# Patient Record
Sex: Male | Born: 1955 | Race: White | Hispanic: No | Marital: Married | Smoking: Never smoker
Health system: Southern US, Community
[De-identification: ages and names within clinical notes are randomized; demographics above are authoritative.]

## PROBLEM LIST (undated history)

## (undated) DIAGNOSIS — C44310 Basal cell carcinoma of skin of unspecified parts of face: Secondary | ICD-10-CM

## (undated) DIAGNOSIS — K439 Ventral hernia without obstruction or gangrene: Secondary | ICD-10-CM

## (undated) DIAGNOSIS — L659 Nonscarring hair loss, unspecified: Secondary | ICD-10-CM

## (undated) DIAGNOSIS — E785 Hyperlipidemia, unspecified: Secondary | ICD-10-CM

## (undated) HISTORY — PX: WISDOM TOOTH EXTRACTION: SHX21

## (undated) HISTORY — DX: Nonscarring hair loss, unspecified: L65.9

## (undated) HISTORY — DX: Ventral hernia without obstruction or gangrene: K43.9

## (undated) HISTORY — PX: OTHER SURGICAL HISTORY: SHX169

## (undated) HISTORY — DX: Hyperlipidemia, unspecified: E78.5

## (undated) HISTORY — PX: TONSILLECTOMY: SUR1361

## (undated) HISTORY — PX: APPENDECTOMY: SHX54

---

## 2005-08-17 ENCOUNTER — Encounter: Admission: RE | Admit: 2005-08-17 | Discharge: 2005-08-17 | Payer: Self-pay | Admitting: Internal Medicine

## 2005-10-12 ENCOUNTER — Ambulatory Visit: Payer: Self-pay | Admitting: Internal Medicine

## 2005-10-21 ENCOUNTER — Ambulatory Visit: Payer: Self-pay

## 2005-11-09 ENCOUNTER — Ambulatory Visit: Payer: Self-pay | Admitting: Internal Medicine

## 2006-07-18 ENCOUNTER — Ambulatory Visit: Payer: Self-pay | Admitting: Internal Medicine

## 2006-07-19 ENCOUNTER — Encounter (INDEPENDENT_AMBULATORY_CARE_PROVIDER_SITE_OTHER): Payer: Self-pay | Admitting: Specialist

## 2006-07-19 ENCOUNTER — Ambulatory Visit: Payer: Self-pay | Admitting: Internal Medicine

## 2006-09-29 ENCOUNTER — Ambulatory Visit: Payer: Self-pay | Admitting: Internal Medicine

## 2006-10-03 ENCOUNTER — Ambulatory Visit: Payer: Self-pay | Admitting: Cardiology

## 2006-10-07 ENCOUNTER — Inpatient Hospital Stay (HOSPITAL_COMMUNITY): Admission: AD | Admit: 2006-10-07 | Discharge: 2006-10-12 | Payer: Self-pay | Admitting: Cardiology

## 2006-10-07 ENCOUNTER — Ambulatory Visit: Payer: Self-pay | Admitting: Internal Medicine

## 2006-10-12 ENCOUNTER — Ambulatory Visit: Payer: Self-pay | Admitting: Internal Medicine

## 2006-11-09 ENCOUNTER — Ambulatory Visit: Payer: Self-pay | Admitting: Internal Medicine

## 2007-01-02 ENCOUNTER — Ambulatory Visit: Payer: Self-pay | Admitting: Internal Medicine

## 2007-08-08 ENCOUNTER — Ambulatory Visit: Payer: Self-pay | Admitting: Internal Medicine

## 2007-09-20 ENCOUNTER — Ambulatory Visit: Payer: Self-pay | Admitting: Internal Medicine

## 2007-11-27 DIAGNOSIS — K5190 Ulcerative colitis, unspecified, without complications: Secondary | ICD-10-CM | POA: Insufficient documentation

## 2007-11-27 DIAGNOSIS — E785 Hyperlipidemia, unspecified: Secondary | ICD-10-CM

## 2007-11-27 DIAGNOSIS — K649 Unspecified hemorrhoids: Secondary | ICD-10-CM | POA: Insufficient documentation

## 2008-03-18 ENCOUNTER — Ambulatory Visit: Payer: Self-pay | Admitting: Internal Medicine

## 2008-06-26 ENCOUNTER — Telehealth: Payer: Self-pay | Admitting: Internal Medicine

## 2008-07-02 ENCOUNTER — Ambulatory Visit: Payer: Self-pay | Admitting: Internal Medicine

## 2008-07-12 ENCOUNTER — Telehealth: Payer: Self-pay | Admitting: Internal Medicine

## 2008-11-29 ENCOUNTER — Ambulatory Visit: Payer: Self-pay | Admitting: Internal Medicine

## 2009-01-01 ENCOUNTER — Telehealth: Payer: Self-pay | Admitting: Internal Medicine

## 2009-02-03 ENCOUNTER — Ambulatory Visit: Payer: Self-pay | Admitting: Internal Medicine

## 2009-02-04 LAB — CONVERTED CEMR LAB: Glucose, Bld: 99 mg/dL (ref 70–99)

## 2009-03-04 ENCOUNTER — Telehealth: Payer: Self-pay | Admitting: Internal Medicine

## 2009-03-04 ENCOUNTER — Ambulatory Visit: Payer: Self-pay | Admitting: Internal Medicine

## 2009-03-05 LAB — CONVERTED CEMR LAB
Basophils Absolute: 0 10*3/uL (ref 0.0–0.1)
Basophils Relative: 0.6 % (ref 0.0–3.0)
Eosinophils Absolute: 0.1 10*3/uL (ref 0.0–0.7)
Eosinophils Relative: 2.1 % (ref 0.0–5.0)
HCT: 39.4 % (ref 39.0–52.0)
Hemoglobin: 14 g/dL (ref 13.0–17.0)
Lymphocytes Relative: 28.7 % (ref 12.0–46.0)
Lymphs Abs: 1.2 10*3/uL (ref 0.7–4.0)
MCHC: 35.6 g/dL (ref 30.0–36.0)
MCV: 91.3 fL (ref 78.0–100.0)
Monocytes Absolute: 0.4 10*3/uL (ref 0.1–1.0)
Monocytes Relative: 9.4 % (ref 3.0–12.0)
Neutro Abs: 2.5 10*3/uL (ref 1.4–7.7)
Neutrophils Relative %: 59.2 % (ref 43.0–77.0)
Platelets: 211 10*3/uL (ref 150.0–400.0)
RBC: 4.31 M/uL (ref 4.22–5.81)
RDW: 12.7 % (ref 11.5–14.6)
WBC: 4.2 10*3/uL — ABNORMAL LOW (ref 4.5–10.5)

## 2009-04-10 ENCOUNTER — Ambulatory Visit: Payer: Self-pay | Admitting: Internal Medicine

## 2009-04-10 LAB — CONVERTED CEMR LAB
Basophils Absolute: 0 10*3/uL (ref 0.0–0.1)
Basophils Relative: 0.3 % (ref 0.0–3.0)
Eosinophils Absolute: 0.2 10*3/uL (ref 0.0–0.7)
Eosinophils Relative: 3.3 % (ref 0.0–5.0)
HCT: 43.4 % (ref 39.0–52.0)
Hemoglobin: 15.1 g/dL (ref 13.0–17.0)
Lymphocytes Relative: 35 % (ref 12.0–46.0)
Lymphs Abs: 1.8 10*3/uL (ref 0.7–4.0)
MCHC: 34.7 g/dL (ref 30.0–36.0)
MCV: 91.4 fL (ref 78.0–100.0)
Monocytes Absolute: 0.5 10*3/uL (ref 0.1–1.0)
Monocytes Relative: 9.4 % (ref 3.0–12.0)
Neutro Abs: 2.5 10*3/uL (ref 1.4–7.7)
Neutrophils Relative %: 52 % (ref 43.0–77.0)
Platelets: 255 10*3/uL (ref 150.0–400.0)
RBC: 4.74 M/uL (ref 4.22–5.81)
RDW: 12.8 % (ref 11.5–14.6)
WBC: 5 10*3/uL (ref 4.5–10.5)

## 2009-05-27 ENCOUNTER — Ambulatory Visit: Payer: Self-pay | Admitting: Internal Medicine

## 2009-06-23 ENCOUNTER — Ambulatory Visit: Payer: Self-pay | Admitting: Internal Medicine

## 2009-06-30 ENCOUNTER — Telehealth: Payer: Self-pay | Admitting: Internal Medicine

## 2009-11-06 ENCOUNTER — Ambulatory Visit: Payer: Self-pay | Admitting: Internal Medicine

## 2009-12-25 ENCOUNTER — Ambulatory Visit: Payer: Self-pay | Admitting: Internal Medicine

## 2009-12-25 DIAGNOSIS — K439 Ventral hernia without obstruction or gangrene: Secondary | ICD-10-CM | POA: Insufficient documentation

## 2010-01-05 ENCOUNTER — Ambulatory Visit: Payer: Self-pay | Admitting: Internal Medicine

## 2010-02-09 ENCOUNTER — Telehealth: Payer: Self-pay | Admitting: Internal Medicine

## 2010-07-14 ENCOUNTER — Telehealth: Payer: Self-pay | Admitting: Internal Medicine

## 2010-07-17 ENCOUNTER — Ambulatory Visit: Payer: Self-pay | Admitting: Internal Medicine

## 2010-11-03 NOTE — Assessment & Plan Note (Signed)
Summary: discuss meds.--ch.    History of Present Illness Visit Type: Follow-up Visit Primary GI MD: [name] MD Primary Provider: Marlan Palau, MD Requesting Provider: n/a Chief Complaint: Discuss Medications, No GI complaints History of Present Illness:   This is a 55 year old white male with ulcerative colitis predominantly with left colon involvement. He has been asymptomatic for over 6 months and he is interested in discontinuation of his Imuran 50 mg daily. He denies rectal bleeding, abdominal pain or change in the bowel habits which are regular. His diagnosis was made in October 2007 and he had a subsequent flareup in January 2008, September 2009, January 2010 and again in May 2010. He is currently on Asacol 2.8 g daily and Imuran 50 mg a day. He has not used Canasa suppositories except as needed for hemorrhoidal flare ups.   GI Review of Systems      Denies abdominal pain, acid reflux, belching, bloating, chest pain, dysphagia with liquids, dysphagia with solids, heartburn, loss of appetite, nausea, vomiting, vomiting blood, weight loss, and  weight gain.        Denies anal fissure, black tarry stools, change in bowel habit, constipation, diarrhea, diverticulosis, fecal incontinence, heme positive stool, hemorrhoids, irritable bowel syndrome, jaundice, light color stool, liver problems, rectal bleeding, and  rectal pain.    Current Medications (verified): 1)  Asacol 400 Mg  Tbec (Mesalamine) .... Take 4 Tablets By Mouth 3 Times Per Day 2)  Imuran 50 Mg Tabs (Azathioprine) .... Take 1  Tablet By Mouth Once Daily  Allergies (verified): No Known Drug Allergies  Past History:  Past Medical History: Last updated: 11/27/2007 Current Problems:  HEMORRHOIDS (ICD-455.6) COLITIS, ULCERATIVE (ICD-556.9) HYPERLIPIDEMIA (ICD-272.4)  Past Surgical History: Last updated: 11/27/2007 appendectomy tonsillectomy wisdom teeth extraction index finger sugery  Family History: Last  updated: 03/18/2008 Family History of Heart Disease: Father No FH of Colon Cancer:  Social History: Last updated: 04/10/2009 Patient is married Patient has 3 children Occupation: Dispensing optician Patient has never smoked.  Alcohol Use - yes-1-2 beers daily Daily Caffeine Use 1 cup tea Illicit Drug Use - no Patient gets regular exercise. Workout every other day, walking daily  Review of Systems  The patient denies allergy/sinus, anemia, anxiety-new, arthritis/joint pain, back pain, blood in urine, breast changes/lumps, change in vision, confusion, cough, coughing up blood, depression-new, fainting, fatigue, fever, headaches-new, hearing problems, heart murmur, heart rhythm changes, itching, muscle pains/cramps, night sweats, nosebleeds, shortness of breath, skin rash, sleeping problems, sore throat, swelling of feet/legs, swollen lymph glands, thirst - excessive, urination - excessive, urination changes/pain, urine leakage, vision changes, and voice change.         Pertinent positive and negative review of systems were noted in the above HPI. All other ROS was otherwise negative.   Vital Signs:  Patient profile:   55 year old male Height:      72 inches Weight:      182 pounds BMI:     24.77 BSA:     2.05 Pulse rate:   62 / minute BP sitting:   118 / 76  (left arm)  Vitals Entered By: Merri Ray CMA Duncan Dull) (December 25, 2009 11:47 AM)  Physical Exam  General:  Well developed, well nourished, no acute distress. Eyes:  PERRLA, no icterus. Mouth:  No deformity or lesions, dentition normal. Neck:  Supple; no masses or thyromegaly. Lungs:  Clear throughout to auscultation. Heart:  Regular rate and rhythm; no murmurs, rubs,  or bruits. Abdomen:  soft, nontender  abdomen with normoactive bowel sounds. Liver at the costal margin. 8 cm ventral hernia at the right lower quadrant incision which is brought on by sitting up. Extremities:  No clubbing, cyanosis, edema or deformities  noted. Skin:  Intact without significant lesions or rashes. Psych:  Alert and cooperative. Normal mood and affect.   Impression & Recommendations:  Problem # 1:  COLITIS, ULCERATIVE (ICD-556.9) Patient has ulcerative colitis which is currently in remission. We will discontinue Imuran at this time. Patient will continue on Asacol 2.8 g a day and may decrease to 2.4 g a day by June 2011. He will call was if he has a flareup. We will see him in one year.  Problem # 2:  VENTRAL HERNIA (ICD-553.20) Patient has a small incisional hernia in the right lower quadrant. It appears to be larger than on my last exam. Nothing has to be done at the moment but we will follow it over a period of time.  Patient Instructions: 1)  discontinue Imuran. 2)  May decrease Asacol to 2.4 g a day. 3)  Office visit one year. 4)  Copy sent to : Dr Lurlean Nanny Baxley 5)  The medication list was reviewed and reconciled.  All changed / newly prescribed medications were explained.  A complete medication list was provided to the patient / caregiver.

## 2010-11-03 NOTE — Progress Notes (Signed)
Summary: Medication refill   Phone Note Call from Patient   Caller: Patient Call For: Dr. Juanda Chance Reason for Call: Refill Medication Summary of Call: Needs a refill on his Asacol.[facility] Initial call taken by: Karna Christmas,  July 14, 2010 11:37 AM  Follow-up for Phone Call        Prescription [facility]ed to [facility]. Follow-up by: Lamona Curl CMA Duncan Dull),  July 14, 2010 12:38 PM    Prescriptions: ASACOL 400 MG  TBEC (MESALAMINE) Take 4 tablets by mouth 3 times per day  #1080 x 0   Entered by:   Lamona Curl CMA (AAMA)   Authorized by:   Hart Carwin MD   Signed by:   Lamona Curl CMA (AAMA) on 07/14/2010   Method used:   Faxed to ...       [facility] (mail-order)       [address] [place], [place]  [address]       Ph: [phone]       Fax: [phone]   RxID:   [id number]

## 2010-11-03 NOTE — Progress Notes (Signed)
Summary: refills   Phone Note Call from Patient Call back at Home Phone [phone]   Caller: Patient Call For: Juanda Chance Reason for Call: Refill Medication Summary of Call: Patient needs refills on his Asacol sent in to [facility] Initial call taken by: Tawni Levy,  Feb 09, 2010 4:31 PM  Follow-up for Phone Call        Prescription sent. Follow-up by: Lamona Curl CMA Duncan Dull),  Feb 09, 2010 5:09 PM    New/Updated Medications: ASACOL 400 MG  TBEC (MESALAMINE) Take 4 tablets by mouth 3 times per day Prescriptions: ASACOL 400 MG  TBEC (MESALAMINE) Take 4 tablets by mouth 3 times per day  #1080 x 0   Entered by:   Lamona Curl CMA (AAMA)   Authorized by:   Hart Carwin MD   Signed by:   Lamona Curl CMA (AAMA) on 02/09/2010   Method used:   Faxed to ...       [facility] (mail[facility])       [address] [place], [place]  [address]       Ph: [phone]       Fax: [phone]   RxID:   [id number]

## 2011-02-16 NOTE — Assessment & Plan Note (Signed)
[facility]                         GASTROENTEROLOGY OFFICE NOTE   NAME:VALCHARNamari, Breton                      MRN:          [id number]  DATE:09/20/2007                            DOB:          November 30, 1955    [name] is a 55 year old gentleman who has ulcerative colitis  initially diagnosed in October 2007 as a proctitis.  He eventually  developed pancolitis requiring hospitalization in January 2008.  He had  another flare-up this fall, which was easily controlled by oral  steroids.  He developed glucose intolerance, actually steroid-induced  diabetes, which is now in remission.  He is doing very well today, being  able to taper off of his prednisone from initial 20 mg a day to  currently 5 mg a day.  He will start on 2.5 mg a day next week.  He is  also on mesalamine 3.6 g a day and we are planning for him to stay on it  for several months before we would cut back on it.  He has physical exam  by Dr. Lenord Fellers, one is coming up.  It appears that these flare-ups seem  to be seasonal.  So far, his flare-up always occurs in the fall.   MEDICATIONS:  1. Asacol 420 mg 3 p.o. t.i.d.  2. Prednisone 5 mg p.o. daily.  3. Multiple vitamins.  4. He also has Canasa suppository 1000 mg, which he currently is not      taking.   PHYSICAL EXAM:  Blood pressure 110/80, pulse 60, and weight 191 pounds,  which is his usual weight.  The patient was not examined again.   We discussed treatment of ulcerative colitis, and possibility that we  may need to do a Prometheus profile to determine inflammatory bowel  disease antibodies.  He is reluctant to have this expensive test done at  this time, because he has exhausted his flexible account, but he will  have it done on next appointment.  He denies any rectal bleeding,  abdominal pain, or change in bowel habits which seem to be regular.  His  level of energy is good and he has not missed any work.   IMPRESSION:  A  55 year old white male with ulcerative colitis in  remission.   PLAN:  1. Continue to taper prednisone.  2. Continue the same dose of Asacol 3.6 g a day in 3 divided doses      over the next several months.  3. I will obtain Prometheus profile next time.  4. High fiber diet.  5. I will see him in 6 months.     Hedwig Morton. Juanda Chance, MD  Electronically Signed    DMB/MedQ  DD: 09/20/2007  DT: 09/20/2007  Job #: [id number]   cc:   Luanna Cole. Lenord Fellers, M.D.

## 2011-02-16 NOTE — Assessment & Plan Note (Signed)
[facility]                         GASTROENTEROLOGY OFFICE NOTE   NAME:[name]                      MRN:          [id number]  DATE:08/08/2007                            DOB:          1956/04/29    [name] is a 55 year old gentleman with ulcerative colitis.  He had  a first presentation in October 2007.  He went into remission in the  winter 2008 and did well until mid September of this year when he had  another flare up.  He did not call us, but started himself on prednisone  20 mg a day.  His symptoms included loose stools, frequency, abdominal  discomfort, bloating, and rectal bleeding.  After using 20 mg of  prednisone for two weeks his symptoms have subsided enough for him to  cut back to 15 mg a day for a week, only for the symptoms to recur  again.  He went back up to 20 mg a day for two weeks and has been doing  fine for past several days.  He again decreased his prednisone to 15 mg  a day.  His stools are formed.  He has no abdominal pain.  He has not  seen any blood in his stools.  He used a few Canasa suppositories which  he had.  Concomitantly with his flare-up of colitis, the patient  increased his Asacol from 1.2 g a day up to 2.4 g a day which he is  continuing at this time.   PHYSICAL EXAMINATION:  VITAL SIGNS:  Blood pressure 116/64, pulse 60,  weight 191 pounds.  GENERAL:  He was alert, oriented, and in no distress.  LUNGS:  Clear to auscultation.  COR:  Normal S1, S2.  ABDOMEN:  Soft with normoactive bowel sounds, tenderness in right lower  quadrant and in left lower quadrant.  Post appendectomy scar in the  right lower quadrant.  RECTAL:  Anoscopic exam revealed inflamed red anal mucosa and mucosa of  the rectal ampulla with no friability.  There was purulent material and  purulent-appearing mucus in the rectal ampulla.  Stool was Hemoccult  negative.   IMPRESSION:  A 55 year old white male with second flare up of  ulcerative  colitis which responded to prednisone, although he says now  asymptomatic.  His anoscopy exam reveals mild activity of the disease.  For that reason we need to continue on his current treatment.   PLAN:  1. Continue prednisone 15 mg a day, then go down by 2.5 mg a day every      two weeks so he would off by October 10, 2007.  2. Increase Asacol to total of 3.6 g a day which would be three      tablets three times a day.  3. I have offered patient tranquilizers or SSRI to prevent his stress      which seems to be causing his exacerbation, but he did not want to      take anything.  4. Bland diet.  Avoid spicy foods and eating out, and limit alcohol      intake.  5. I need  to see him in six weeks to monitor his progress.  We would      consider in the future use of immunomodulator if it appears that      his disease keeps reccuring.     Hedwig Morton. Juanda Chance, MD  Electronically Signed    DMB/MedQ  DD: 08/08/2007  DT: 08/09/2007  Job #: [id number]   cc:   Luanna Cole. Lenord Fellers, M.D.

## 2011-02-19 NOTE — Discharge Summary (Signed)
[name], [name] NO.:  [id number]   MEDICAL RECORD NO.:  [id number]          PATIENT TYPE:  INP   LOCATION:  6742                         FACILITY:  [facility]   PHYSICIAN:  Dave Moccasin, PA-C    DATE OF BIRTH:  04/09/1956   DATE OF ADMISSION:  10/07/2006  DATE OF DISCHARGE:  10/12/2006                               DISCHARGE SUMMARY   1. 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  Rule out extension of colitis, rule out superimposed      infectious colitis.  2. Remote appendectomy in 1984.  3. Hyperlipidemia.   DISCHARGE DIAGNOSES:  1. Pancolitis, brought under control with steroids.  Lab assays did      not confirm a diagnosis of infectious etiology.  2. Hyperglycemia secondary to use of IV and oral steroids.  Sugars      also exacerbated during this admission by the brief use of total      nutrient admixture therapy.   CONSULTATIONS:  With Dr. Gardiner Barefoot for input on management of  hyperglycemia.   PROCEDURES:  None.   BRIEF HISTORY:  [name] is a pleasant 55 year old white gentleman.  The patient had undergone a screening colonoscopy in February 2007.  This study was normal including the rectum.  In October of 2007, he  developed painless rectal bleeding.  He underwent a flexible  sigmoidoscopy in October by [name] and she diagnosed ulcerative  proctitis.  He was treated with Canasa suppositories.  The bleeding  subsided.  In early December, while on a business trip in [place], he  developed severe diarrhea and self treated with Imodium and the symptoms  resolved.  He did lose 13 pounds over the course of the diarrhea which  lasted about a week.  Later in December around the 27th, when he was  seen in the office, he had been having a recurrence of abdominal pain  and bleeding along with fecal urgency and soft stools for about a week.  He was still using the Canasa suppositories at that time.  An  endoscopy  performed September 30, 2007 performed by [name] in the office showed  erythema in the anal canal and rectal ampulla with some blood clot  present but no obvious colitis in this limited view.  A CT scan of the  abdomen and pelvis were obtained on October 03, 2006 and this revealed  colitis extending now from the mid-ascending through the transverse into  the sigmoid colon.  Cecum was spared.  Following the flexible  sigmoidoscopy, the patient was placed on Cipro and Flagyl.   Despite the course of antibiotics and ongoing Canasa suppositories as  well as a low-residue diet, the patient continued to have severe  diarrhea and hematochezia.  He had ended up losing about 20 pounds over  the last 3 weeks and [name] elected to admit him to the hospital on  October 07, 2006.   LABORATORY:  White blood cell count 12.9, hemoglobin 13.6, hematocrit  40.2, platelets 425,000.  Sed rate 25.  Sodium  137, potassium 4.2,  chloride 102, CO2 28.  Glucose ranging anywhere from 170-256.  BUN 10,  creatinine 0.74.  Total protein 5.9.  Albumin 2.7, total bilirubin 0.3,  alkaline phosphatase 54, AST 42, ALT 55.  Glycosylated hemoglobin level  6.3.  Triglyceride level 164.  TSH 3.357.  Prealbumin was 19.2.  On  recheck, it was 27.2.  Urinalysis was negative.  Stool for C. diff was  negative, fecal active ferritin was positive and stool cultures revealed  no Salmonella, Shigella, Campylobacter or Yersinia.  It did show a  reduced normal flora present.   HOSPITAL COURSE:  The patient was admitted to the hospital and started  on IV Solu-Medrol along with a diet limited to clear liquids.  He was  started on Flagyl IV, continued on oral Asacol.  The patient was also  started on central TNA and required a PICC line placement for this.  Over the course of the 5-day hospitalization, the patient's stools calm  down to the point where he was having less frequent stools, less visible  blood and near  resolution of his abdominal pain.   Ultimately, the patient's colitis symptoms were stabilized to the point  where he could be discharged home and his diet advanced to low residue.  He was discharged to home in stable condition on a dose of prednisone 60  mg a day along with ongoing Asacol.  He had a follow-up appointment  arranged for November 09, 2006 at 10:15.   Next problem is hyperglycemia, rule out diabetes mellitus type 2.  The  patient had been started on prednisone 40 mg a day 1 day prior to this  admission.  On initial serum assay, his blood glucose was 256.  We  started him on sliding scale insulin and regular capillary blood glucose  checks.  His blood sugar was labile, running from the 130 and as high as  280.  Dr. Luciana Axe, hospitalist, evaluated the patient and was unclear as  to whether the patient was having purely steroid and TNA induced  hyperglycemia or whether he has underlying type 2 diabetes.  A  hemoglobin A1c was minimally elevated at 6.3.  Ultimately, the patient  was taken off IV Solu-Medrol and placed on oral prednisone which  medication he would continue at discharge.  [name] TNA was also  tapered off on October 11, 2006 and the blood sugars did reflect some  improvement after discontinuation of the TNA.  Plan for follow-up in the  next few weeks with Dr. Lenord Fellers.   The patient was seen by the diabetic teaching nurse and received  instructions as to following a diabetic diet and some details as to the  disease of diabetes and what might be causing it.   DISCHARGE MEDICATIONS:  1. Mesalamine/Asacol 400 mg capsule 4 p.o. t.i.d. for a total of 4.8      grams total daily dosing.  2. Prednisone 60 mg p.o. daily for 2 weeks, then 50 mg p.o. daily for      2 weeks, then 40 mg p.o. daily.  3. Levbid (hyoscyamine) 0.375 mg twice daily as needed for abdominal      cramps and discomfort. 4. Imodium p.r.n. for diarrhea.   Again appointments with [name] on  November 09, 2006 at 10:15 and he  was to contact Dr. Beryle Quant office to make an appointment to recheck  blood sugars in the next 2-3 weeks.  Condition of this patient at  discharge was stable.  Diet for the patient was to be low residue and  low carbohydrate modified.      Dave Moccasin, PA-C     SG/MEDQ  D:  12/05/2006  T:  12/05/2006  Job:  [id number]   cc:   Hedwig Morton. Dave Chance, MD  Luanna Cole. Lenord Fellers, M.D.

## 2011-02-19 NOTE — Assessment & Plan Note (Signed)
[facility]                         GASTROENTEROLOGY OFFICE NOTE   NAME:[name]                      MRN:          [id number]  DATE:10/07/2006                            DOB:          March 25, 1956    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  Just before Christmas 2007,  he  developed acute gastroenteritis all while traveling to [facility],  resulting in diarrhea, nausea, inability to eat.  He really has never  recovered from that and it became apparent that this may be an  ulcerative colitis with acute flare-up.  A CT scan of the abdomen  several days ago showed diffuse thickening of the colon.  His stool  cultures have been all negative.  His stool lactoferrin was positive.  His sed rate has been elevated to 48 and he has been passing small,  bloody stools.  He has lost about 20 pounds since we saw him in October  from 200 to 181 pounds.  I asked him to come in the office this morning  to decide whether he needs to be admitted.   MEDICATIONS:  1. Prednisone 40 mg p.o. daily, started 48 hours ago.  2. Isocal 4.8 gm daily, just started.  3. Cipro 250 mg p.o. b.i.d. this is day 6.  4. Flagyl 250 mg p.o. t.i.d.  The patient has completed it.   PHYSICAL EXAMINATION:  Blood pressure 120/80, pulse 76, weight 181  pounds.  The patient appeared ill and thin.  LUNGS:  Are clear to auscultation.  NECK:  Was supple.  No lymphadenopathy.  The oral cavity was normal.  Sclerae is nonicteric.  COR:  With rapid S1, S2.  No murmur.  ABDOMEN:  Was soft though diffusely tender with a decreased bowel  sounds.  Most of the tenderness was in the right lower and middle  quadrant but also some in the left lower quadrant.  RECTAL:  He had frank blood on the glove, no stool.  There was some  tenderness in the inner canal.  A  specimen was strongly Hemoccult  positive.   IMPRESSION:  A 55 year old gentleman with acute ulcerative colitis,  which has evolved from proctitis to pancolitis as of last week on CT  scan demonstrated diffuse thickening of the left and right colon, with  sparring of the cecum.  He has lost 20 pounds and shows signs of  malnutrition and mild dehydration.  In order to turn this around, I  believe he needs a complete bowel rest, IV steroids and definite  diagnostic workup, although his stool cultures have been negative, one  wonders if there is a precipitating factor that brought this on.   PLAN:  Admit today for bowel rest, IV steroids, IV Flagyl, methylamine,  possible central TNA depending on his prealbumin levels.  This has been  discussed with patient and his wife.  The patient will have to wait  until the bed is available at [facility] or [facility]  Hedwig Morton. Juanda Chance, MD  Electronically Signed    DMB/MedQ  DD: 10/07/2006  DT: 10/07/2006  Job #: [id number]   cc:   Luanna Cole. Lenord Fellers, M.D.

## 2011-02-19 NOTE — Assessment & Plan Note (Signed)
[facility]                           GASTROENTEROLOGY OFFICE NOTE   NAME:VALCHARGregori, Abril                      MRN:          [id number]  DATE:07/18/2006                            DOB:          [birth year]    Mr. Credit is a 55 year old gentleman whom we saw in February 2007 for  screening colonoscopy.  His exam was normal including his rectum.  There  were no hemorrhoids.  He now has new onset rectal bleeding, black-red blood  mixed with mucus, almost with each bowel movement, which seem to be rather  loose, but he denies actual diarrhea. He denies abdominal pain.  There is no  family history of ulcerative colitis, Crohn's disease.  The last episode of  bleeding was this morning.  There are only small amounts of blood and he  denies any rectal pain.  He attributes the beginning of the bleeding to  stopping Zetia which he was taking for about 6 months for hyperlipidemia.  He also questions possibility of seafood triggering this episode because it  started when he was in [place] eating some raw seafood.   PHYSICAL EXAMINATION:  Blood pressure 112/68.  Pulse 72 and weight 200  pounds.  He was alert and oriented in no distress.  LUNGS:  Clear to auscultation.  HEART:  Normal S1, S2.  Anoscopic exam shows normal perianal.  Normal rectal tone.  No evidence of  hemorrhoids.  A rectal sigmoid mucosa bleeding, oozing with blood consistent  with proctitis.  Stool is heme positive.  There was some visible blood on  the stool.  There were no aphthous ulcers.   IMPRESSION:  A 55 year old gentleman with active proctitis, possibly  ulcerative colitis of the left colon, the extent is not clear.  Based on the  endoscopic exam it involves at least last 5 or 6 cm from the rectum. Clearly  this has developed since his colonoscopy in February.   PLAN:  Flexible sigmoidoscopy to assess the extent of the disease.  1. Canasa suppositories 1000 mg every night.  2.  Booklet on ulcerative colitis.  3. Depending on the extent of the disease, we may need CORT enemas or even      systemic steroids, but I hope that the Canasa suppositories may be      sufficient to control the inflammatory changes in the rectum.  We also      checking with sed rate and CBC today.       Hedwig Morton. Juanda Chance, MD    DMB/MedQ  DD:  07/18/2006  DT:  07/18/2006  Job #:  [id number]   cc:   Luanna Cole. Lenord Fellers, M.D.

## 2011-02-19 NOTE — H&P (Signed)
NAMEBROWNING, SOUTHWOOD             ACCOUNT NO.:  [id number]   MEDICAL RECORD NO.:  [id number]          PATIENT TYPE:  INP   LOCATION:  6742                         FACILITY:  [facility]   PHYSICIAN:  Hedwig Morton. Juanda Chance, MD     DATE OF BIRTH:  [birth year]   DATE OF ADMISSION:  10/07/2006  DATE OF DISCHARGE:                              HISTORY & PHYSICAL   CHIEF COMPLAINT:  Severe diarrhea and rectal bleeding.   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  His primary physician is Dr. Luanna Cole. [place].  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  He starting passing small amounts of bright  red blood with the diarrhea and says that he was having several stools  per day all week.  Once he returned home, he felt a little bit better  for several days and then since September 21, 2006, has started with  diarrhea again.  He says he has a bowel movement every time he eats.  He  is generally having a bowel movement at least every 2 hours including  nocturnally and is passing bright red blood with most bowel movements.  He has not had any fever or chills recently but did have an episode of  chills 3 weeks ago when this started.  He has been slightly nauseated  but has not had any vomiting and has been able to eat.  He has also had  weight loss of about 20 pounds over the past 3 weeks.  He had called the  office and was placed on a course of Cipro 500 b.i.d. and Flagyl 250  q.i.d. empirically and had CT scan of the abdomen and pelvis done, which  shows a diffuse colitis.  Endoscopy was done on September 29, 2006, which  did show evidence of proctitis and his flex was done July 19, 2006,  showing proctitis from 0-5 cm.  He had undergone previous colonoscopy  in  February 2007, which was normal.  Stool cultures have been done and are  negative, lactoferrin is positive.  At this time he was started on  prednisone within the past 24 hours, but calls today not feeling any  better, still having very frequent diarrhea and hematochezia, and he is  admitted for more intensive medical therapy.   CURRENT MEDICATIONS:  1. Flagyl 250 t.i.d.  2. Cipro 500 b.i.d.  3. Canasa suppositories b.i.d.  4. He started prednisone at 40 mg per day just yesterday.   ALLERGIES:  NO KNOWN DRUG ALLERGIES.   PAST MEDICAL HISTORY:  1. Pertinent for the ulcerative proctitis, recently diagnosed.  2. Status post remote appendectomy in 1984.  3. Hyperlipidemia.   FAMILY HISTORY:  Negative for GI disease.  Positive for coronary artery  disease.   SOCIAL HISTORY:  The patient is married, he is employed as a Research scientist (life sciences), travels frequently for  business, he is a nonsmoker, does drink  1-2 beers per day.   REVIEW OF SYSTEMS:  CARDIOVASCULAR:  Denies any chest pain or anginal  symptoms.  PULMONARY:  Negative for cough, shortness of breath, or  sputum production.  GENITOURINARY:  Denies any dysuria, urgency, or  frequency.  GI:  As outlined above.  MUSCULOSKELETAL:  Negative.   PHYSICAL EXAMINATION:  GENERAL:  Well-developed, healthy appearing,  white male in no acute distress.  VITAL SIGNS:  Blood pressure 120/80. pulse in the 80s, weight is 181.4,  he is afebrile, thin.  HEENT:  Nontraumatic, normocephalic.  EOMI, PERRLA.  Sclera anicteric.  NECK:  Supple without nodes.  CARDIOVASCULAR:  Slightly tachy, regular rhythm with S1 and S2.  PULMONARY:  Clear to A&P.  ABDOMEN:  Mildly tender, diffusely in the lower abdomen.  Bowel sounds  are present but hypoactive.  There is guarding or rebound, no mass or  hepatosplenomegaly.  RECTAL:  Per Dr. Juanda Chance is hemoccult positive.  EXTREMITIES:  Without clubbing, cyanosis, or edema.  NEURO:  Grossly nonfocal.    LABORATORY DATA:  Pending at this time.   IMPRESSION:  A 55 year old white male with recent diagnosis of  ulcerative proctitis, now with acute exacerbation of hematochezia and  diarrhea x3 weeks and CT findings of a pancolitis.  Rule out extension  of his colitis.  Rule out superimposed infectious colitis.   PLAN:  1. Obtain stool cultures.  2. IV steroids.  3. Bowel rest.  4. Start Asacol.  5. Consider cTNA  depending on his nutritional parameters ( serum      prealbumin).  For details, please see the orders.      Amy Brownell, PA-C      Dora M. Juanda Chance, MD  Electronically Signed    AE/MEDQ  D:  10/07/2006  T:  10/08/2006  Job:  [id number]   cc:   Luanna Cole. Lenord Fellers, M.D.

## 2011-02-19 NOTE — Assessment & Plan Note (Signed)
[facility]                         GASTROENTEROLOGY OFFICE NOTE   NAME:[name], [name]                      MRN:          [id number]  DATE:01/02/2007                            DOB:          1956-02-22    Mr. Splawn is a very nice 55 year old gentleman with left-sided  ulcerative colitis requiring hospitalization from January 4 to October 12, 2006. He is now in remission. His bowel habits are regular. There has  been no rectal bleeding. He was able to taper off  his steroids to 10 mg  a day and eventually stopped them 4 weeks ago. His weight has increased  to currently 184 pounds.   PHYSICAL EXAMINATION:  Blood pressure 114/78, pulse 78, weight 184  pounds. The patient was not examined today.   We have discussed further treatment of his ulcerative colitis. Since he  is in remission, we can cut back on his Asacol to a total of 3.6 grams  per day in 3 divided doses for at least a month. If he has no further  symptoms, he could continue to taper off  his Asacol to 6 tablets a day  which will be a total of 2.4 grams a day. I told him not to decrease his  Asacol below 6 a day. I will see him again in 6 months. We discussed  possibly of obtaining IBD markers from a [employer] profile, because of  the expense of it and the fact that his insurance would not likely cover  this expense, we decided not to purse this blood test.     Hedwig Morton. Juanda Chance, MD  Electronically Signed    DMB/MedQ  DD: 01/02/2007  DT: 01/02/2007  Job #: [id number]   cc:   Luanna Cole. Lenord Fellers, M.D.

## 2011-02-19 NOTE — Assessment & Plan Note (Signed)
[facility]                         GASTROENTEROLOGY OFFICE NOTE   NAME:[name], [name]                      MRN:          [id number]  DATE:09/29/2006                            DOB:          27-Dec-1955    [name] is a 55 year old gentleman with newly diagnosed ulcerative  proctitis.  He now is an acute work-in because of recurrence of rectal  bleeding and abdominal pain.  On colonoscopy on July 19, 2006, he was  found to have ulcerative proctitis from 0 cm to 5 cm.  He was treated  with suppositories and bleeding subsided.  On his trip to [facility] on  December 3 he developed severe diarrhea with multiple stools over a  period of several days.  He took Imodium and by the time we talked to  him on September 17, 2006, he was 100% better and did well for about a  week without any specific treatment.  He lost about 13 pounds.  The  bleeding and abdominal pain started about a week ago with soft stools,  some urgency every 2 hours.  He got up at 2 a.m. last night and passed  blood and at 5:15 a.m. this morning as well.  There have been some  chills and possibly fever.  He denies actual diarrhea but stools are  usually soft.   MEDICATIONS:  Fish oil, Canasa suppositories, and multivitamins.   PHYSICAL EXAMINATION:  VITAL SIGNS:  Blood pressure 112/70, pulse 88 and  weight 187 pounds, which represents a 13-pound weight loss.  GENERAL:  He appeared healthy, in no distress.  SKIN:  Warm and dry.  LUNGS:  Clear to auscultation.  CARDIAC:  With normal S1, normal S2.  Somewhat rapid heart rate.  ABDOMEN:  Soft but very tender in the left lower quadrant as well as in  the right lower quadrant, more so in the left lower quadrant.  Transverse colon was normal.  There was no rebound.  Bowel sounds were  somewhat hyperactive.  RECTAL AND ANOSCOPIC:  Exam shows some mild erythema in the anal canal  and rectal ampulla but no obvious colitis.  Blood clot is  present in the  rectal ampulla, apparently passed from in the sigmoid colon.   IMPRESSION:  A 55 year old gentleman with acute colitis, this time  following an acute onset of diarrheal illness which assume was a viral  or bacterial gastroenteritis.  After a short period of remission he now  has what sounds like a recurrent colitis.  This time his proctitis seems  to be much better and I feel we may be dealing with either exacerbation  of ulcerative pancolitis or exacerbation of infectious colitis.  I have  not ruled out the possibility of ischemic colitis as well, since he was  somewhat dehydrated during the period of his acute diarrhea.  His blood  is clearly coming from the proximal colon rather than from the rectum.   PLAN:  1. CT scan of the abdomen and pelvis with IV and oral contrast today      to assess the extent and severity of the colitis.  2.  CMET, CBC, sed rate, stool culture, O&P, and C. difficile toxin, as      well as wbc's.  3. Begin Cipro 500 mg p.o. b.i.d. x10 days, Flagyl 250 mg p.o. t.i.d.      x10 days.  4. Low residue diet.   I will see him in 2 weeks or earlier if necessary.     [name]. Dave Chance, MD  Electronically Signed    DMB/MedQ  DD: 09/29/2006  DT: 09/29/2006  Job #: [id number]   cc:   [name], M.D.

## 2011-02-19 NOTE — Assessment & Plan Note (Signed)
[facility]                         GASTROENTEROLOGY OFFICE NOTE   NAME:[name], [name]                      MRN:          [id number]  DATE:11/09/2006                            DOB:          06/28/1956    Mr. Pelc is a 55 year old gentleman with severe ulcerative colitis  requiring hospitalization at [facility].  He was last admitted  from  [facility] on October 07, 2006.  He was discharged about 4 days later  on high-dose steroids.  His last CT scan of the abdomen showed  pancolitis, which evolved from a proctitis.  He also has become a  diabetic secondary to his steroids.  He comes today for 1st followup  after his hospitalization.   MEDICATIONS:  1. Prednisone 40 mg starting today.  Prior to that, he was on 60 mg,      then 50 mg.  2. He is also on Isocal 1.6 gm 3 times a day.  Took maximum dose of      4.8 gm a day.  3. Pentasa suppositories, which he has been able to discontinue.   He has some hemorrhoids today, but denies rectal bleeding or abdominal  pain.  His bowel movements are solid, 1 a day.  He has been able to  increase the fiber in his diet and start on his exercise program.   PHYSICAL EXAMINATION:  Blood pressure 112/62.  Pulse 78.  Weight 180  pounds.  Patient did not appear to be cushingoid.  LUNGS:  Clear to auscultation.  COR:  With normal S1 and S2.  ABDOMEN:  Soft and non-tender.  ANOSCOPIC EXAM:  Showed a thrombosed hemorrhoid at the anal orifice, 2+  hemorrhoids internally.  Non-tender.  The mucosa of the rectal ampulla  was entirely normal, only with slight erythema, but no friability or  bleeding.  Stool was Hemoccult negative.   IMPRESSION:  1. Complete resolution of the inflammatory changes of the rectum.  2. Resolving ulcerative colitis flare up.  3. Symptomatic hemorrhoid with thrombosis.   PLAN:  1. Continue Pentasa suppositories for the hemorrhoids.  2. Decrease prednisone by 10 mg every 4 days  down to 20 mg, then by 5      mg every 2 weeks.  3. Continue Asacal at 4 .8 gm a day.  Consider switching to Azulfidine      because of the cost.  4. Reexamination in 2 months.  5. Continue exercise and high-fiber diet.     Hedwig Morton. Juanda Chance, MD  Electronically Signed    DMB/MedQ  DD: 11/09/2006  DT: 11/09/2006  Job #: [id number]   cc:   Luanna Cole. Lenord Fellers, M.D.

## 2011-02-19 NOTE — Consult Note (Signed)
NAMESHAYAAN, PARKE NO.:  [id number]   MEDICAL RECORD NO.:  [id number]          PATIENT TYPE:  INP   LOCATION:  6742                         FACILITY:  [facility]   PHYSICIAN:  Gardiner Barefoot, MD    DATE OF BIRTH:  Sep 28, 1956   DATE OF CONSULTATION:  DATE OF DISCHARGE:                                 CONSULTATION   PRIMARY CARE PHYSICIAN:  Dr. Lenord Fellers.   HISTORY OF PRESENT ILLNESS:  Mr. Kruzel is a 55 year old male who was  previously healthy, recently been diagnosed with ulcerative prostatitis  by Dr. Juanda Chance in October 2007.  He had initially been placed on Canasa  suppositories and had been doing well until 3 weeks ago had exacerbation  of diarrhea while he was traveling.  Prior to the hospitalization, was  started on Cipro and Flagyl, and a CT scan at that time was consistent  with his acute colitis.  An endoscopy was also performed on September 29, 2006, which did show proctitis and patient the day prior to admission  was placed on prednisone.  Since being on the prednisone during  admission he has had some high blood sugars including a 256 initially as  well as 170.  The patient was also noted with an elevated A1c of 6.3 on  October 08, 2006.  Patient has since had diabetic teaching and been on a  sliding scale insulin here, inhouse.   PAST MEDICAL HISTORY:  Ulcerative colitis.   MEDICATIONS:  1. Prednisone.  2. Isocal.  3. Cipro.  4. Status post Flagyl.   SOCIAL HISTORY:  The patient denies any smoking, occasional drinking,  and denies drug use.   FAMILY HISTORY:  No history of diabetes.  The father with high  cholesterol and CABG.   ALLERGIES:  NO KNOWN DRUG ALLERGIES.   REVIEW OF SYSTEMS:  A complete 12-point review of systems was obtained  and was negative other than that as presented in history of present  illness.   PHYSICAL EXAMINATION:  VITAL SIGNS:  Temperature 97.7, pulse 85,  respirations 18, blood pressure 125/76, O2 saturations 98%  on room air.  GENERAL:  This patient is awake, alert, and oriented x3, and appears in  no acute distress.  CARDIOVASCULAR:  Regular rate and rhythm with no murmurs, rubs, or  gallops.  LUNGS:  Clear to auscultation bilaterally.  ABDOMEN:  Soft, nontender, nondistended, positive bowel sounds, no  hepatosplenomegaly.  EXTREMITIES:  No cyanosis, clubbing, or edema.   LABORATORY DATA:  Blood sugars range from 170 to 256.  A1c is 6.3.  Other labs have remained within normal limits.  Triglycerides 154.   ASSESSMENT/PLAN:  1. Hyperglycemia.  Although his A1c is slightly elevated at 6.3,      despite only having been on prednisone for approximately 5 days, it      is unclear at this time if he truly has diabetes or not.  Certainly      the hyperglycemia is mainly secondary to the prednisone; however he      may have an underlying type 2 diabetes that was elicited with the  steroids at hospitalization.  The patient has already been      counseled on diet and has been seen by the nutritionist and      understands the calorie counting and has Accu-Chek to check his      blood sugars at home.  The patient was advised to monitor his blood      sugars closely at home including fasting blood sugars in the a.m.      as well as pre and post meals until he is seen by his physician Dr.      Lenord Fellers in approximately 1 week.  Patient understands all the      instructions and understands that he may have diabetes at this time      and will have to further be evaluated as he weans off his      prednisone.  Of note, he is going to be on predinsone for the next      several months, most likely with a slow wean.  2. Hypertriglyceridemia.  The patient does report that he was placed      on Zetia but decided not to take it on his own because he was      afraid of any side effects, although he does not report having had      any.  The patient does state that he is going to watch his diet and      exercise  and to lower his risks that way.      Gardiner Barefoot, MD  Electronically Signed     RWC/MEDQ  D:  10/11/2006  T:  10/12/2006  Job:  [id number]

## 2011-03-22 ENCOUNTER — Other Ambulatory Visit: Payer: Self-pay | Admitting: Internal Medicine

## 2011-03-22 MED ORDER — MESALAMINE 400 MG PO TBEC: DELAYED_RELEASE_TABLET | ORAL | Status: DC

## 2011-03-22 NOTE — Telephone Encounter (Signed)
Patient has scheduled office visit for 05/03/11. We will give him 3 month supply of meds with the understanding that he must have appointment for further refills. Patient was decreased to 2.4 mg daily of Asacol at his last office visit , therefore I have changed the prescription to reflect that.

## 2011-03-22 NOTE — Telephone Encounter (Signed)
Patient needs office visit for refills. Dr Juanda Chance wanted to see patient 1 year from his last visit which was 12/25/09. Patient states he will call back to schedule visit.

## 2011-03-31 ENCOUNTER — Telehealth: Payer: Self-pay | Admitting: Internal Medicine

## 2011-03-31 NOTE — Telephone Encounter (Signed)
Gave patient samples of Asacol HD until his shipment of Asacol 400 mg tablets come in the mail. Patient states that he normally takes 2 tablets by mouth twice daily. I have advised patient to take 1 tablet twice daily of the Asacol 800 mg tablets. Patient verbalizes understanding.

## 2011-05-03 ENCOUNTER — Encounter: Payer: Self-pay | Admitting: Internal Medicine

## 2011-05-03 ENCOUNTER — Ambulatory Visit (INDEPENDENT_AMBULATORY_CARE_PROVIDER_SITE_OTHER): Payer: BC Managed Care – PPO | Admitting: Internal Medicine

## 2011-05-03 VITALS — BP 112/78 | HR 64 | Ht 72.0 in | Wt 183.8 lb

## 2011-05-03 DIAGNOSIS — K5100 Ulcerative (chronic) pancolitis without complications: Secondary | ICD-10-CM

## 2011-05-03 MED ORDER — CIPROFLOXACIN HCL 500 MG PO TABS: 500.0000 mg | ORAL_TABLET | Freq: Two times a day (BID) | ORAL | Status: DC

## 2011-05-03 NOTE — Progress Notes (Signed)
[name] 1956-07-21 MRN [id number]     History of Present Illness:  This is a 55 year old white male with ulcerative colitis predominantly involving the left colon. The initial diagnosis was made in October 2007. He had flareups in January 2008, September 2009, January 2010 and in May 2010. His last office visit was in March 2011. He was in remission and we, at that time stopped his Imuran. He tapered his Asacol from 2.4 g one year ago to currently 1.2 g. He remains in symptomatic remission. He denies diarrhea, abdominal pain or rectal bleeding. He has been on a carbohydrate free diet. His last colonoscopy in January 2008 showed pancolitis. He would be due for a repeat colonoscopy in January 2013 or, if he remains in remission, he could wait another 2 years. He wants to discontinue his Asacol. He is developing alopecia. His dermatologist feels this could be an autoimmune process or possibly related to Asacol.   Past Medical History  Diagnosis Date  . Hemorrhoids   . Ulcerative colitis   . Hyperlipidemia   . Ventral hernia   . Hair loss    Past Surgical History  Procedure Date  . Appendectomy   . Tonsillectomy   . Wisdom tooth extraction   . Index finger surgery     reports that he has never smoked. He quit smokeless tobacco use about 4 years ago. He reports that he drinks alcohol. He reports that he does not use illicit drugs. family history includes Heart disease in his father.  There is no history of Colon cancer. No Known Allergies      Review of Systems: He denies chest pain shortness of breath abdominal pain, diarrhea or rectal bleeding  The remainder of the 10  point ROS is negative except as outlined in H&P   Physical Exam: General appearance  Well developed, in no distress. Eyes- non icteric. HEENT nontraumatic, normocephalic. Mouth no lesions, tongue papillated, no cheilosis. Neck supple without adenopathy, thyroid not enlarged, no carotid bruits, no  JVD. Lungs Clear to auscultation bilaterally. Cor normal S1 normal S2, regular rhythm , no murmur,  quiet precordium. Abdomen soft nontender. Normoactive bowel sounds. Ventral hernia right lower quadrant. Rectal: Soft Hemoccult negative stool. Extremities no pedal edema. Skin no lesions. Neurological alert and oriented x 3. Psychological normal mood and affect.  Assessment and Plan:  Problem#1 Ulcerative colitis in remission. Patient wants to discontinue his medications because of alopecia. We will discontinue Asacol. If his hair grows back, he will stay off Asacal. Otherwise he will stay on at least 2 Asacal/day. He would be due for a colonoscopy in the next  year or 2. He will be followed by Dr. Lenord Fellers for general medical care.   05/03/2011 [name]

## 2011-05-03 NOTE — Patient Instructions (Addendum)
Discontinue Asacol as per your request. Follow up with Dr Juanda Chance in 1 year Dr Judie Petit.[place]

## 2011-06-16 ENCOUNTER — Encounter: Payer: Self-pay | Admitting: Internal Medicine

## 2011-06-17 ENCOUNTER — Ambulatory Visit (INDEPENDENT_AMBULATORY_CARE_PROVIDER_SITE_OTHER): Payer: BC Managed Care – PPO | Admitting: Internal Medicine

## 2011-06-17 ENCOUNTER — Encounter: Payer: Self-pay | Admitting: Internal Medicine

## 2011-06-17 VITALS — BP 118/68 | HR 62 | Temp 97.4°F | Ht 72.0 in | Wt 185.0 lb

## 2011-06-17 DIAGNOSIS — L639 Alopecia areata, unspecified: Secondary | ICD-10-CM | POA: Insufficient documentation

## 2011-06-17 DIAGNOSIS — L219 Seborrheic dermatitis, unspecified: Secondary | ICD-10-CM

## 2011-06-17 DIAGNOSIS — K5190 Ulcerative colitis, unspecified, without complications: Secondary | ICD-10-CM

## 2011-06-17 DIAGNOSIS — L210 Seborrhea capitis: Secondary | ICD-10-CM | POA: Insufficient documentation

## 2011-06-17 NOTE — Progress Notes (Signed)
  Subjective:    Patient ID: [name], male    DOB: [birth year], 55 y.o.   MRN: [id number]  HPI history of inflammatory bowel disease. Some 3 months ago he began losing hair on his scalp. He went to see a dermatologist who prescribed a couple of different creams for him Temovate and Protopic. Has not seen any improvement. Has been using a shampoo he obtained from a health food store that contains rosemary. Says as a child he had very sensitive skin and had to use Dreft to wash his clothes. Has noticed some scaling& itching in his scalp. Is under a lot of stress. 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 He is wife just finished remodeling a kitchen.    Review of Systems     Objective:   Physical Exam several patches of significant alopecia on his scalp. Scale  consistent with seborrhea.        Assessment & Plan:  Seborrhea scalp  Alopecia areata  Ulcerative colitis  Plan is to draw TSH and B12. Consider steroid injections to scalp for alopecia. Try Nizoral 2% shampoo to use on scalp twice weekly. He shampoos his hair every day. Suggested mild shampoo to use in between shampoos with Nizoral such as Purpose shampoo

## 2011-06-18 ENCOUNTER — Encounter: Payer: Self-pay | Admitting: Internal Medicine

## 2011-06-18 LAB — VITAMIN B12: Vitamin B-12: 254 pg/mL (ref 211–911)

## 2011-06-18 LAB — T4, FREE: Free T4: 1.03 ng/dL (ref 0.80–1.80)

## 2011-06-18 LAB — TSH: TSH: 2.072 u[IU]/mL (ref 0.350–4.500)

## 2011-08-24 ENCOUNTER — Telehealth: Payer: Self-pay | Admitting: Internal Medicine

## 2011-08-24 NOTE — Telephone Encounter (Signed)
Asacal 400mg , 3tabs po tid, = 9/day. Prednisone 30 mg po qd x 2 weeks then go down by 5mg  every 2 weeks. Will need an  Appointment with me or an extender in next few weeks if he does not start improving

## 2011-08-24 NOTE — Telephone Encounter (Signed)
Spoke with patient and he states he is having an ulcerative colitis flare. Started about one month ago.He reports 1-2 solid stools/day with bright red blood streaked on them. He is also having abdominal cramping. He restarted his Asacol 400 mg at 3 tab in AM and 3 tab in PM. He also states he took Prednisone 60 mg and tapered it down by 10 mg daily. He did not see improvement so he started back on 60 mg today and planned to taper by 10 mg daily again. States he has 1/2 bottle of Asacol left and just enough Prednisone to complete the taper he started.  Hx- ulcerative colitis left colon. Please, advise.

## 2011-08-25 MED ORDER — MESALAMINE 400 MG PO TBEC: 1200.0000 mg | DELAYED_RELEASE_TABLET | Freq: Three times a day (TID) | ORAL | Status: DC

## 2011-08-25 MED ORDER — PREDNISONE 5 MG PO TABS: ORAL_TABLET | ORAL | Status: DC

## 2011-08-25 NOTE — Telephone Encounter (Signed)
Pt aware and rx sent to pharmacy. 

## 2011-09-09 ENCOUNTER — Encounter: Payer: Self-pay | Admitting: Internal Medicine

## 2011-12-28 ENCOUNTER — Other Ambulatory Visit: Payer: Self-pay | Admitting: Internal Medicine

## 2011-12-28 MED ORDER — MESALAMINE 400 MG PO TBEC: 1200.0000 mg | DELAYED_RELEASE_TABLET | Freq: Three times a day (TID) | ORAL | Status: DC

## 2011-12-28 NOTE — Telephone Encounter (Signed)
rx sent

## 2012-02-09 ENCOUNTER — Ambulatory Visit (INDEPENDENT_AMBULATORY_CARE_PROVIDER_SITE_OTHER): Payer: BC Managed Care – PPO | Admitting: Internal Medicine

## 2012-02-09 ENCOUNTER — Other Ambulatory Visit (INDEPENDENT_AMBULATORY_CARE_PROVIDER_SITE_OTHER): Payer: BC Managed Care – PPO

## 2012-02-09 ENCOUNTER — Encounter: Payer: Self-pay | Admitting: Internal Medicine

## 2012-02-09 VITALS — BP 100/72 | HR 68 | Ht 72.0 in | Wt 182.2 lb

## 2012-02-09 DIAGNOSIS — K5100 Ulcerative (chronic) pancolitis without complications: Secondary | ICD-10-CM

## 2012-02-09 DIAGNOSIS — K625 Hemorrhage of anus and rectum: Secondary | ICD-10-CM

## 2012-02-09 LAB — CBC WITH DIFFERENTIAL/PLATELET
Basophils Absolute: 0.1 10*3/uL (ref 0.0–0.1)
Basophils Relative: 1.1 % (ref 0.0–3.0)
Eosinophils Absolute: 0.2 10*3/uL (ref 0.0–0.7)
Eosinophils Relative: 3 % (ref 0.0–5.0)
HCT: 46 % (ref 39.0–52.0)
Hemoglobin: 15.9 g/dL (ref 13.0–17.0)
Lymphocytes Relative: 25.9 % (ref 12.0–46.0)
Lymphs Abs: 1.6 10*3/uL (ref 0.7–4.0)
MCHC: 34.5 g/dL (ref 30.0–36.0)
MCV: 89.5 fl (ref 78.0–100.0)
Monocytes Relative: 9.3 % (ref 3.0–12.0)
Neutro Abs: 3.8 10*3/uL (ref 1.4–7.7)
Platelets: 288 10*3/uL (ref 150.0–400.0)
RBC: 5.13 Mil/uL (ref 4.22–5.81)
RDW: 12.6 % (ref 11.5–14.6)
WBC: 6.3 10*3/uL (ref 4.5–10.5)

## 2012-02-09 MED ORDER — HYDROCORTISONE ACETATE 25 MG RE SUPP: 25.0000 mg | RECTAL | Status: AC

## 2012-02-09 MED ORDER — MESALAMINE 1000 MG RE SUPP: 1000.0000 mg | Freq: Every day | RECTAL | Status: DC

## 2012-02-09 MED ORDER — MESALAMINE 1.2 G PO TBEC: DELAYED_RELEASE_TABLET | ORAL | Status: DC

## 2012-02-09 NOTE — Progress Notes (Signed)
[name] 12-01-1955 MRN [id number]   History of Present Illness:  This is a 56 year old white male with a flareup of ulcerative colitis. He was initially diagnosed in 2007 and has had periodic flareups in 2008, twice in 2010 and the last flareup in October 2012. His last colonoscopy in February 2007  was normal. He developed alopecia from Asacol which was discontinued but he restarted it because of a flareup of his colitis. He finished his prednisone taper on February 17. He is having formed stools and mucus as well as blood mixed with stools. He has urgency and tenesmus consistent with proctitis.   Past Medical History  Diagnosis Date  . Hemorrhoids   . Ulcerative colitis   . Hyperlipidemia   . Ventral hernia   . Hair loss    Past Surgical History  Procedure Date  . Appendectomy   . Tonsillectomy   . Wisdom tooth extraction   . Index finger surgery     reports that he has never smoked. He quit smokeless tobacco use about 5 years ago. He reports that he drinks alcohol. He reports that he does not use illicit drugs. family history includes Heart disease in his father.  There is no history of Colon cancer. No Known Allergies      Review of Systems: Denies heartburn nausea chest pain  The remainder of the 10 point ROS is negative except as outlined in H&P   Physical Exam: General appearance  Well developed, in no distress. Eyes- non icteric. HEENT nontraumatic, normocephalic. Mouth no lesions, tongue papillated, no cheilosis. Neck supple without adenopathy, thyroid not enlarged, no carotid bruits, no JVD. Lungs Clear to auscultation bilaterally. Cor normal S1, normal S2, regular rhythm, no murmur,  quiet precordium. Abdomen: Soft relaxed abdomen, nontender. Normal active bowel sounds. Rectal: And anoscopic exam reveals friable bleeding rectal mucosa with heavy exudate and Hemoccult-positive stool. Extremities no pedal edema. Skin no lesions. Neurological alert and  oriented x 3. Psychological normal mood and affect.  Assessment and Plan:  Problem #1 Exacerbation of ulcerative colitis consistent with proctitis. In the past, he has used Canasa suppositories alone. We will be using Canasa suppositories in the morning and hydrocortisone suppositories 25 mg at bedtime. We will also start him on Lialda 3.6 g daily in place of Asacol. I will see him in 4-6 weeks for follow up. We are checking his CBC today.  02/09/2012 [name]

## 2012-02-09 NOTE — Patient Instructions (Signed)
Your physician has requested that you go to the basement for the following lab work before leaving today: CBC We have sent the following medications to your pharmacy: [facility] Hydrocortisone Suppositories Please follow up with Dr Juanda Chance in 6 weeks. CC: Dr Margaree Mackintosh

## 2012-02-21 ENCOUNTER — Other Ambulatory Visit: Payer: Self-pay | Admitting: Internal Medicine

## 2012-02-21 MED ORDER — MESALAMINE 1000 MG RE SUPP: 1000.0000 mg | Freq: Every day | RECTAL | Status: DC

## 2012-02-21 NOTE — Telephone Encounter (Signed)
Pt needs Canasa sent to local pharmacy. I have advised patient that I will send a 30 day prescription to [facility] for him.

## 2012-03-22 ENCOUNTER — Ambulatory Visit (INDEPENDENT_AMBULATORY_CARE_PROVIDER_SITE_OTHER): Payer: BC Managed Care – PPO | Admitting: Internal Medicine

## 2012-03-22 ENCOUNTER — Encounter: Payer: Self-pay | Admitting: Internal Medicine

## 2012-03-22 VITALS — BP 110/78 | HR 72 | Ht 72.0 in | Wt 181.4 lb

## 2012-03-22 DIAGNOSIS — K5100 Ulcerative (chronic) pancolitis without complications: Secondary | ICD-10-CM

## 2012-03-22 MED ORDER — MESALAMINE 1000 MG RE SUPP: 1000.0000 mg | Freq: Every day | RECTAL | Status: DC

## 2012-03-22 MED ORDER — MESALAMINE 1.2 G PO TBEC: DELAYED_RELEASE_TABLET | ORAL | Status: DC

## 2012-03-22 NOTE — Patient Instructions (Addendum)
We have sent the following medications to your pharmacy: [facility] CC: Dr Lenord Fellers

## 2012-03-22 NOTE — Progress Notes (Signed)
[name] 1956-03-01 MRN [id number]  History of Present Illness:  This is a 56 year old white male with ulcerative colitis since 2007. His last office visit was in May 2013. He has been slowly recovering from a flareup which started early this year. His prior flareups were in 2008, 2010 and in October 2012. He currently takes Lialda 3.6 g daily, Canasa suppositories1000 mg every night and hydrocortisone suppositories 25 mg every morning. He is 75% improved. He still has morning urgency. Stools are formed and he sees blood only occasionally. His last colonoscopy was in 2008.   Past Medical History  Diagnosis Date  . Hemorrhoids   . Ulcerative colitis   . Hyperlipidemia   . Ventral hernia   . Hair loss    Past Surgical History  Procedure Date  . Appendectomy   . Tonsillectomy   . Wisdom tooth extraction   . Index finger surgery     reports that he has never smoked. He quit smokeless tobacco use about 5 years ago. He reports that he drinks alcohol. He reports that he does not use illicit drugs. family history includes Heart disease in his father.  There is no history of Colon cancer. No Known Allergies      Review of Systems: Negative for abdominal pain, weight has been stable.  The remainder of the 10 point ROS is negative except as outlined in H&P   Physical Exam: General appearance  Well developed, in no distress. Psychological normal mood and affect.  Assessment and Plan:  Problem #1 Ulcerative colitis slowly going into remission. Patient is still having urgency consistent with proctitis. He will continue Canasa suppositories at bedtime and may taper down the hydrocortisone suppositories in the mornings. He will continue the mesalamine orally. He is due for a recall colonoscopy but wants to wait ; we discussed increased incidence of colon cancer in ulcerative colitis after 8-10 years of having the disease which will be around 2015.   03/22/2012 [name]

## 2012-07-09 ENCOUNTER — Encounter (HOSPITAL_COMMUNITY): Payer: Self-pay | Admitting: *Deleted

## 2012-07-09 ENCOUNTER — Emergency Department (HOSPITAL_COMMUNITY): Payer: BC Managed Care – PPO

## 2012-07-09 ENCOUNTER — Emergency Department (HOSPITAL_COMMUNITY)
Admission: EM | Admit: 2012-07-09 | Discharge: 2012-07-09 | Disposition: A | Discharge status: Home / Self Care | Payer: BC Managed Care – PPO | Attending: Emergency Medicine | Admitting: Emergency Medicine

## 2012-07-09 DIAGNOSIS — M25519 Pain in unspecified shoulder: Secondary | ICD-10-CM | POA: Insufficient documentation

## 2012-07-09 DIAGNOSIS — Z23 Encounter for immunization: Secondary | ICD-10-CM | POA: Insufficient documentation

## 2012-07-09 DIAGNOSIS — IMO0002 Reserved for concepts with insufficient information to code with codable children: Secondary | ICD-10-CM | POA: Insufficient documentation

## 2012-07-09 DIAGNOSIS — W11XXXA Fall on and from ladder, initial encounter: Secondary | ICD-10-CM | POA: Insufficient documentation

## 2012-07-09 DIAGNOSIS — T07XXXA Unspecified multiple injuries, initial encounter: Secondary | ICD-10-CM

## 2012-07-09 MED ORDER — OXYCODONE-ACETAMINOPHEN 5-325 MG PO TABS: 1.0000 | ORAL_TABLET | ORAL | Status: DC | PRN

## 2012-07-09 MED ORDER — TETANUS-DIPHTH-ACELL PERTUSSIS 5-2.5-18.5 LF-MCG/0.5 IM SUSP
0.5000 mL | Freq: Once | INTRAMUSCULAR | Status: AC
  Administered 2012-07-09: 0.5 mL via INTRAMUSCULAR
  Filled 2012-07-09: qty 0.5

## 2012-07-09 MED ORDER — OXYCODONE-ACETAMINOPHEN 5-325 MG PO TABS
2.0000 | ORAL_TABLET | Freq: Once | ORAL | Status: AC
  Administered 2012-07-09: 2 via ORAL
  Filled 2012-07-09: qty 2

## 2012-07-09 NOTE — ED Notes (Signed)
Pt working on roof yesterday and ladder slid with pt landing and sliding to edge of roof;  Did not fall from roof; presents with bruising bilateral forearms; left elbow pain/swelling; right shoulder pain; abrasions noted bilateral shins; no other c/o pain or injury

## 2012-07-09 NOTE — ED Provider Notes (Signed)
History     CSN: [id number]  Arrival date & time 07/09/12  1928   First MD Initiated Contact with Patient 07/09/12 2046      Chief Complaint  Patient presents with  . Fall  . Arm Injury    (Consider location/radiation/quality/duration/timing/severity/associated sxs/prior treatment) HPI Patient here complaining of pain from fall yesterday. He was on a ladder which slid down the wall and he "rode the ladder down". He has scrapes and abrasions of bilateral forearms and bilateral lower legs on the anterior aspect. He did not strike his head and did not lose consciousness. He did not recall through any space. Denies pain in his neck chest back or abdomen. He has been ambulatory. He has been taking ibuprofen as needed for pain. Has contusions on both his forearms and his lower legs with abrasions on the forearms and legs. He states that he thinks his last tetanus shot was within the past 10 years. He denies any blood thinners. Past Medical History  Diagnosis Date  . Hemorrhoids   . Ulcerative colitis   . Hyperlipidemia   . Ventral hernia   . Hair loss     Past Surgical History  Procedure Date  . Appendectomy   . Tonsillectomy   . Wisdom tooth extraction   . Index finger surgery     Family History  Problem Relation Age of Onset  . Heart disease Father   . Colon cancer Neg Hx     History  Substance Use Topics  . Smoking status: Never Smoker   . Smokeless tobacco: Former Neurosurgeon    Quit date: 09/03/2006  . Alcohol Use: 0.6 oz/week    1 Glasses of wine per week     1-2 beers daily      Review of Systems  Constitutional: Negative for fever, chills, diaphoresis, appetite change, fatigue and unexpected weight change.  HENT: Negative for mouth sores and neck stiffness.   Eyes: Negative for visual disturbance.  Respiratory: Negative for cough, chest tightness, shortness of breath and wheezing.   Cardiovascular: Negative for chest pain.  Gastrointestinal: Negative for nausea,  vomiting, abdominal pain, diarrhea, constipation and blood in stool.  Genitourinary: Negative for dysuria, urgency, frequency, hematuria and decreased urine volume.  Musculoskeletal: Negative for myalgias and joint swelling.  Skin: Negative for rash.  Neurological: Negative for syncope, weakness, light-headedness and headaches.  Hematological: Negative for adenopathy.  Psychiatric/Behavioral: Negative for disturbed wake/sleep cycle and agitation. The patient is not nervous/anxious.     Allergies  Review of patient's allergies indicates no known allergies.  Home Medications   Current Outpatient Rx  Name Route Sig Dispense Refill  . HYDROCORTISONE ACETATE 25 MG RE SUPP Rectal Place 1 suppository (25 mg total) rectally every morning. 90 suppository 0  . IBUPROFEN 200 MG PO TABS Oral Take 400 mg by mouth every 6 (six) hours as needed.    Marland Kitchen MESALAMINE 1000 MG RE SUPP Rectal Place 1 suppository (1,000 mg total) rectally at bedtime. 90 suppository 3  . MESALAMINE 1.2 G PO TBEC  Take 3 tablets by mouth once daily 270 tablet 3    BP 154/97  Pulse 74  Temp 98.2 F (36.8 C) (Oral)  Resp 16  Ht 6' (1.829 m)  Wt 185 lb (83.915 kg)  BMI 25.09 kg/m2  SpO2 99%  Physical Exam  Nursing note and vitals reviewed. Constitutional: He is oriented to person, place, and time. He appears well-developed and well-nourished.  HENT:  Head: Normocephalic and atraumatic.  Eyes:  Conjunctivae normal and EOM are normal. Pupils are equal, round, and reactive to light.  Neck: Normal range of motion. Neck supple.  Cardiovascular: Normal rate, normal heart sounds and intact distal pulses.   Pulmonary/Chest: Effort normal and breath sounds normal.  Abdominal: Soft. Bowel sounds are normal.  Musculoskeletal: Normal range of motion.       Contusions and abrasions bilateral forearm. He has some tenderness over the medial aspect of the left elbow. Mild tenderness over the right anterior shoulder. He has abrasions and  contusions of the bilateral lower extremity. His feet are uninsured with dorsal pedalis pulses intact. There is no sensory loss distal to the injury. He has full function of all 4 extremities  Neurological: He is alert and oriented to person, place, and time. He has normal reflexes.  Skin: Skin is warm and dry.  Psychiatric: He has a normal mood and affect.    ED Course  Procedures (including critical care time)  Labs Reviewed - No data to display Dg Shoulder Right  07/09/2012  *RADIOLOGY REPORT*  Clinical Data: Fall, left elbow pain, right shoulder pain.  RIGHT SHOULDER - 2+ VIEW  Comparison: None  Findings: No acute bony abnormality.  Specifically, no fracture, subluxation, or dislocation.  Soft tissues are intact.  IMPRESSION: No acute bony abnormality.   Original Report Authenticated By: Cyndie Chime, M.D.    Dg Elbow Complete Left  07/09/2012  *RADIOLOGY REPORT*  Clinical Data: Post fall, now with left elbow pain  LEFT ELBOW - COMPLETE 3+ VIEW  Comparison: None.  Findings:  No definite fracture or dislocation.  There is minimal calcification within the insertional fibers of the triceps tendon. This finding is without associated adjacent soft tissue swelling. No radiopaque foreign body.  Joint spaces are preserved.  IMPRESSION: 1.  No fracture or elbow joint effusion. 2.  Minimal calcification within the insertional fibers of the triceps tendon.   Original Report Authenticated By: Waynard Reeds, M.D.      No diagnosis found.    MDM  X-rays do not show any evidence of acute fracture. He does have pain at the left elbow. He will be placed in a sling. He is to be given a tetanus shot. He was given a prescription for Percocet for pain. He is advised regarding icing and continued conservative therapy for pain. He is advised to followup with his primary care physician Dr. backs that he continues to have pain in his elbow to have it reexamined.       Hilario Quarry, MD 07/09/12 2059

## 2012-07-09 NOTE — ED Notes (Signed)
Rx given x1 D/c instructions reviewed w/ pt and family - pt and family deny any further questions or concerns at present. Pt ambulating independently w/ steady gait on d/c in no acute distress, A&Ox4.

## 2012-07-09 NOTE — ED Notes (Signed)
Pt reports falling from one ledge to another approx 66ft on his roof yesterday, pt states he did not actually fall off the roof. Pt w/ left elbow pain, progressively worse since yesterday. Pt states he has applied ice at home w/o relief. Pt also w/ contusion to rt FA and abrasions to bilat LE. Pt in no acute distress at present, A&Ox4, pleasant and cooperative.

## 2012-11-18 ENCOUNTER — Other Ambulatory Visit: Payer: Self-pay

## 2013-01-25 ENCOUNTER — Ambulatory Visit (INDEPENDENT_AMBULATORY_CARE_PROVIDER_SITE_OTHER): Payer: BC Managed Care – PPO | Admitting: Internal Medicine

## 2013-01-25 ENCOUNTER — Encounter: Payer: Self-pay | Admitting: Internal Medicine

## 2013-01-25 ENCOUNTER — Other Ambulatory Visit: Payer: BC Managed Care – PPO | Admitting: Internal Medicine

## 2013-01-25 VITALS — BP 124/76 | HR 72 | Temp 96.9°F | Ht 72.0 in | Wt 182.0 lb

## 2013-01-25 DIAGNOSIS — Z0000 Encounter for general adult medical examination without abnormal findings: Secondary | ICD-10-CM

## 2013-01-25 DIAGNOSIS — Z872 Personal history of diseases of the skin and subcutaneous tissue: Secondary | ICD-10-CM

## 2013-01-25 DIAGNOSIS — E785 Hyperlipidemia, unspecified: Secondary | ICD-10-CM

## 2013-01-25 DIAGNOSIS — K5190 Ulcerative colitis, unspecified, without complications: Secondary | ICD-10-CM

## 2013-01-25 LAB — COMPREHENSIVE METABOLIC PANEL
Albumin: 4.3 g/dL (ref 3.5–5.2)
BUN: 18 mg/dL (ref 6–23)
CO2: 26 mEq/L (ref 19–32)
Calcium: 9.8 mg/dL (ref 8.4–10.5)
Chloride: 101 mEq/L (ref 96–112)
Glucose, Bld: 101 mg/dL — ABNORMAL HIGH (ref 70–99)
Potassium: 4.5 mEq/L (ref 3.5–5.3)
Total Protein: 7 g/dL (ref 6.0–8.3)

## 2013-01-25 LAB — POCT URINALYSIS DIPSTICK
Bilirubin, UA: NEGATIVE
Blood, UA: NEGATIVE
Glucose, UA: NEGATIVE
Nitrite, UA: NEGATIVE
Spec Grav, UA: 1.01

## 2013-01-25 LAB — CBC WITH DIFFERENTIAL/PLATELET
HCT: 45 % (ref 39.0–52.0)
Hemoglobin: 15.6 g/dL (ref 13.0–17.0)
Lymphocytes Relative: 34 % (ref 12–46)
Lymphs Abs: 1.4 10*3/uL (ref 0.7–4.0)
MCHC: 34.7 g/dL (ref 30.0–36.0)
Monocytes Absolute: 0.5 10*3/uL (ref 0.1–1.0)
Monocytes Relative: 11 % (ref 3–12)
Neutro Abs: 2.1 10*3/uL (ref 1.7–7.7)
RBC: 4.96 MIL/uL (ref 4.22–5.81)
WBC: 4.1 10*3/uL (ref 4.0–10.5)

## 2013-01-25 LAB — LIPID PANEL
HDL: 51 mg/dL (ref >39)
Triglycerides: 110 mg/dL (ref <150)

## 2013-01-25 NOTE — Patient Instructions (Addendum)
Continue same meds and return in one year. 

## 2013-01-25 NOTE — Progress Notes (Signed)
Subjective:    Patient ID: [name], male    DOB: 1956-09-30, 57 y.o.   MRN: [id number]  HPI 57 year old White Male with history of Inflammatory Bowel Disease (Ulcerative Colitis) last flare was Spring 2013. Went on trip to [facility] hiking and did well. Lifts weights several times a week. Had fall from his roof several months ago and had multiple bruises especially elbow and had tetanus update Fall 2013. This has been treated by Dr. Juanda Chance. Was treated with Imuran and Asacol. Also has had Canasa suppositories. In 2011 he had been asymptomatic for 6 months and Imuran was discontinued. Asacol was decreased to 2.4 g daily. He had a screening colonoscopy at age 27 in February 2007 which was normal. There were no hemorrhoids. Patient begin with rectal bleeding in August 2007. By October 2007 he had rectal bleeding and blood mixed with mucus almost with every bowel movement. In October 2007 , a flexible sigmoidoscopy was done and biopsies were consistent with proctitis and ulcerative colitis. No dysplasia was identified.  He has a history of hyperlipidemia.  In 2012, Dr. 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  Tonsillectomy and 1973, appendectomy 1984, in 1999 he struck his elbow on a window ledge and developed olecranon bursitis. Had flexible sigmoidoscopy at the [facility] in 1995.  No known drug allergies.  In 2007 he had a negative Cardiolite study.  In 2006 he saw Dr. Teressa Senter for right shoulder symptoms and was diagnosed with primary tendinopathy of right rotator cuff  Sutures in right index finger in 1979   FHx: father with hx of thoracic aortic aneurysm. Father with history of coronary artery disease status post CABG x4 in melanoma. One brother and one sister in good health.  Social history: Patient is married and works for a [employer] in [place]. He has a Event organiser. Does not smoke. Drinks beer. 3 children. Travels a lot.   Review of Systems   Constitutional: Negative.   All other systems reviewed and are negative.       Objective:   Physical Exam  Vitals reviewed. Constitutional: He is oriented to person, place, and time. He appears well-developed and well-nourished.  HENT:  Head: Normocephalic and atraumatic.  Right Ear: External ear normal.  Left Ear: External ear normal.  Mouth/Throat: Oropharynx is clear and moist. No oropharyngeal exudate.  Eyes: Conjunctivae are normal. Pupils are equal, round, and reactive to light. Right eye exhibits no discharge. Left eye exhibits no discharge. No scleral icterus.  Neck: Normal range of motion. Neck supple. No JVD present. No thyromegaly present.  Cardiovascular: Normal rate, regular rhythm, normal heart sounds and intact distal pulses.   No murmur heard. Pulmonary/Chest: Effort normal and breath sounds normal. No respiratory distress. He has no wheezes. He has no rales. He exhibits no tenderness.  Abdominal: Soft. Bowel sounds are normal. He exhibits no distension and no mass. There is no rebound and no guarding.  Genitourinary: Prostate normal.  Musculoskeletal: Normal range of motion. He exhibits no edema.  Neurological: He is alert and oriented to person, place, and time. He has normal reflexes. No cranial nerve deficit. Coordination normal.  Skin: Skin is warm and dry.  Psychiatric: He has a normal mood and affect. His behavior is normal. Judgment and thought content normal.          Assessment & Plan:  History of ulcerative colitis currently not on any treatment except Asacol 2.4 g daily  History of hyperlipidemia-patient does  not want to be on statin therapy  History of alopecia areata  Plan: Return in one year or as needed. Total cholesterol ranges in the high 200 range with an LDL cholesterol in the mid 200 range.

## 2013-01-26 ENCOUNTER — Encounter: Payer: BC Managed Care – PPO | Admitting: Internal Medicine

## 2013-01-26 LAB — PSA: PSA: 2.74 ng/mL (ref <=4.00)

## 2013-03-25 IMAGING — CR DG ELBOW COMPLETE 3+V*L*
4 series · 4 of 4 positions shown · non-contrast
Comparison: None.

CLINICAL DATA: Post fall, now with left elbow pain

LEFT ELBOW - COMPLETE 3+ VIEW

[x elbow ap left]
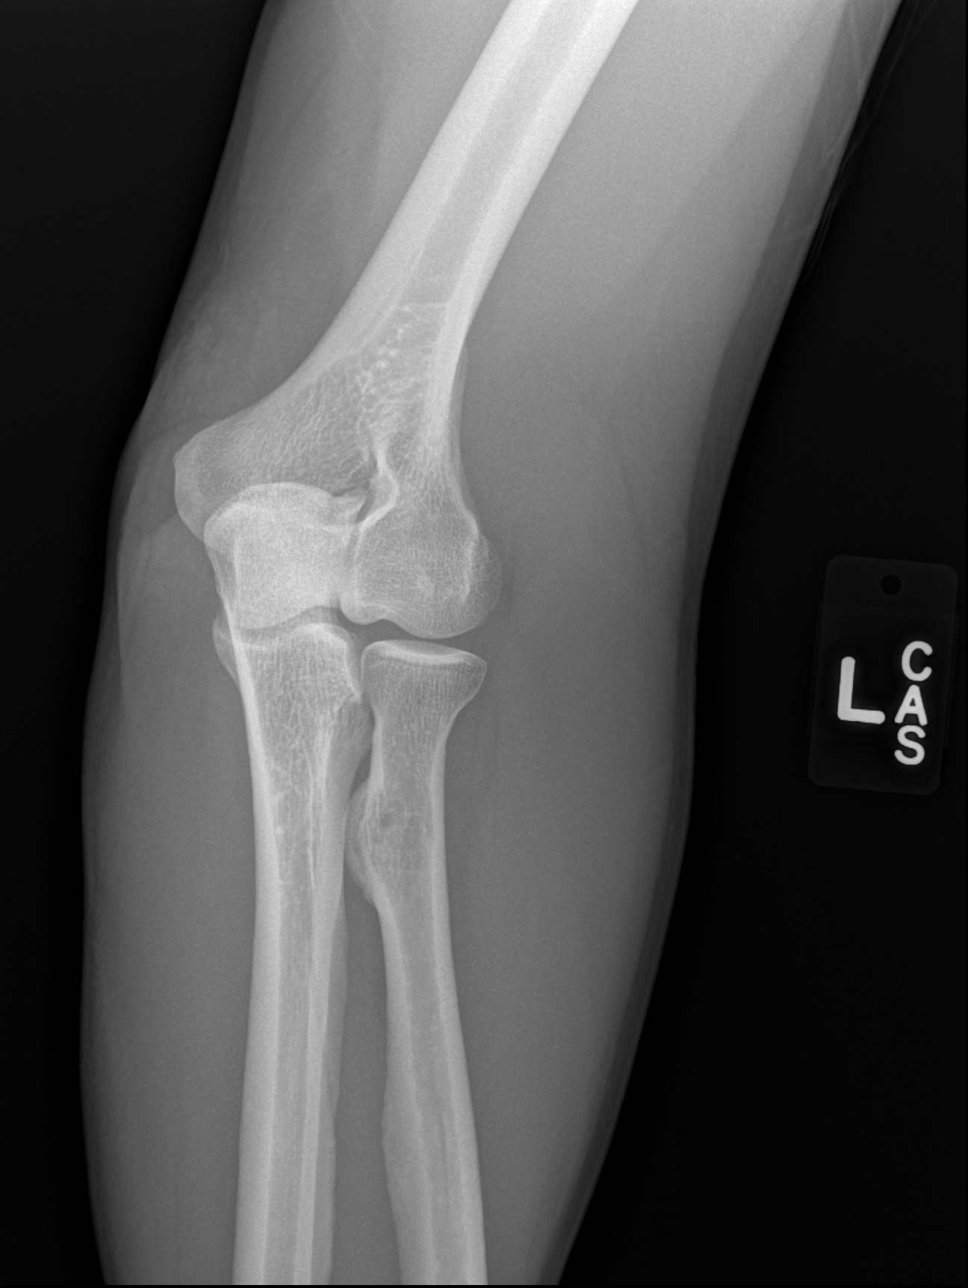

[x elbow obl left (1 of 2)]
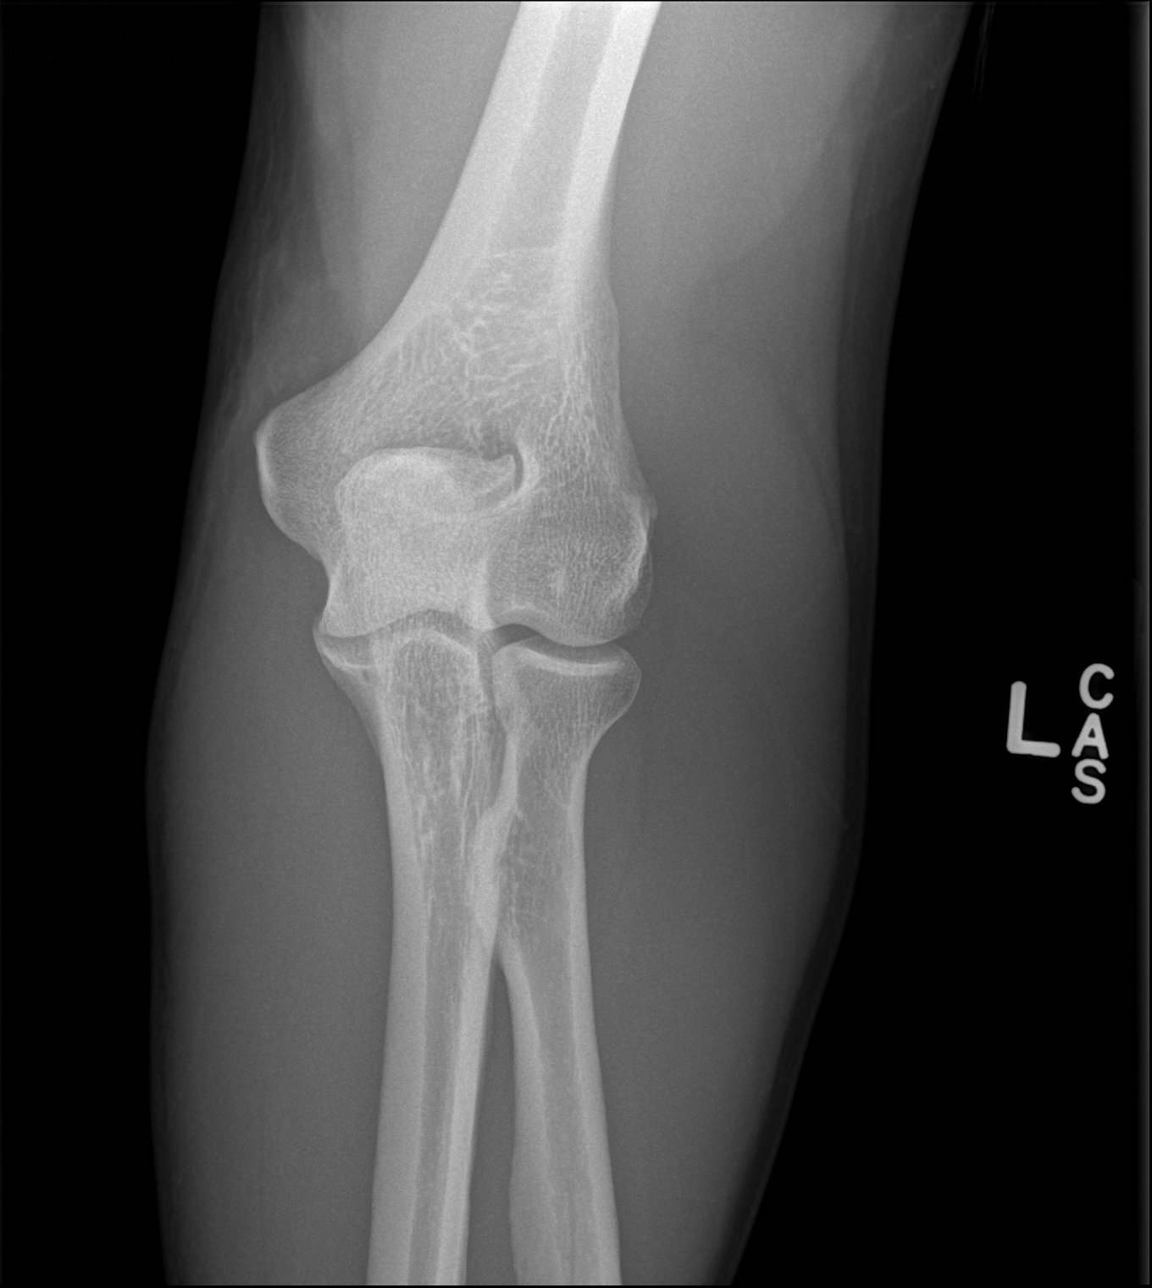

[x elbow obl left (2 of 2)]
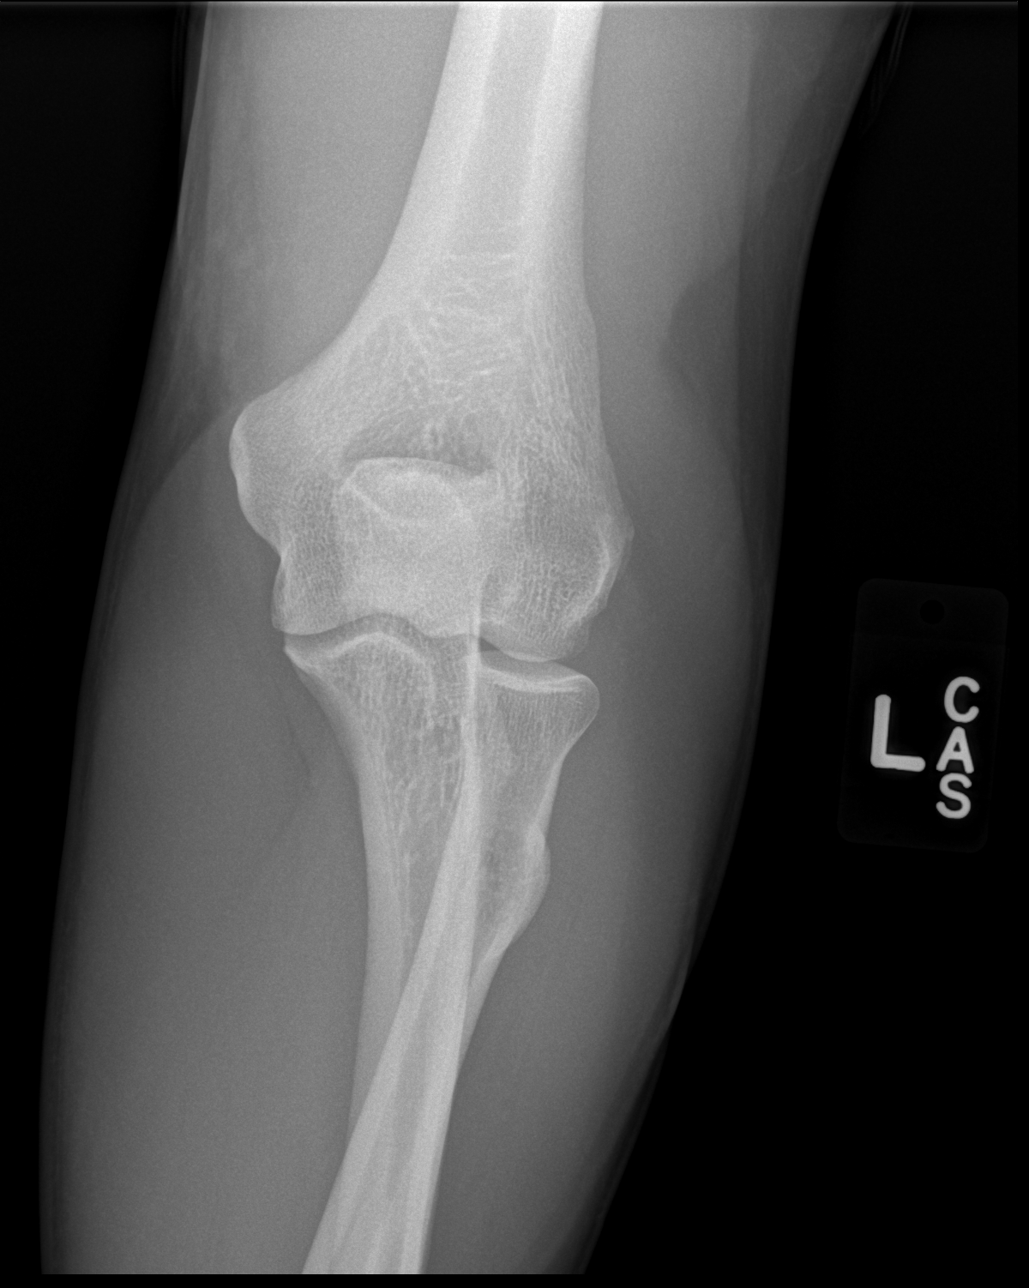

[x elbow lat left]
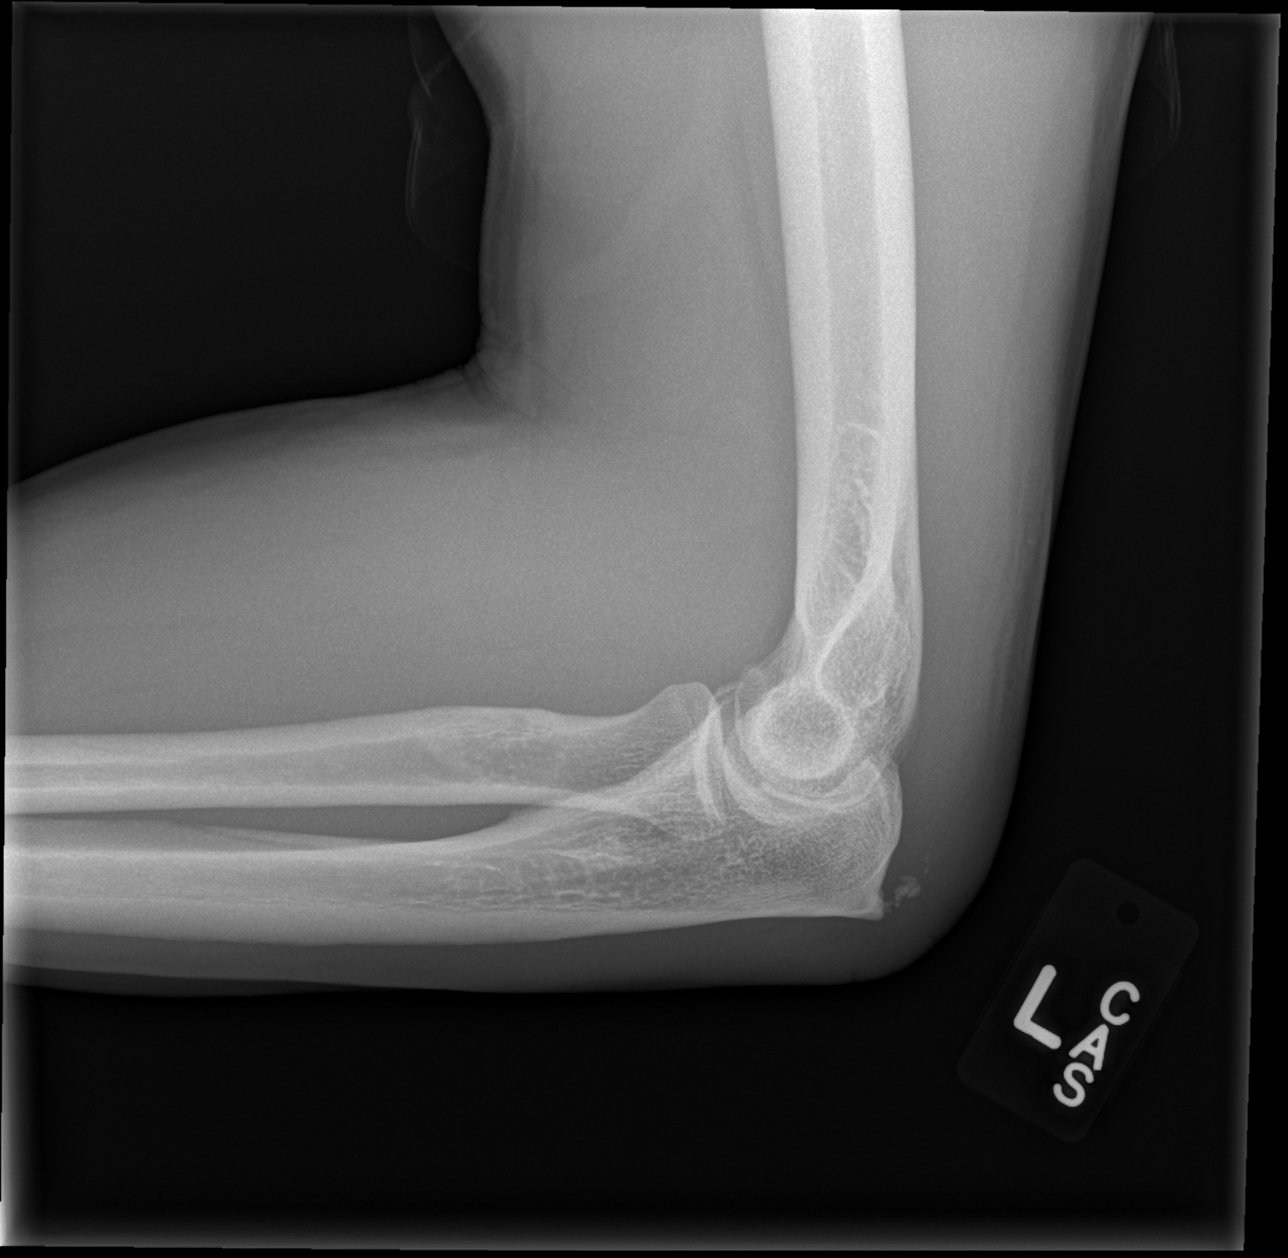

[4 of 4 positions shown; findings below may reference images not displayed]

FINDINGS: No definite fracture or dislocation.  There is minimal
calcification within the insertional fibers of the triceps tendon.
This finding is without associated adjacent soft tissue swelling.
No radiopaque foreign body.  Joint spaces are preserved.
IMPRESSION: 1.  No fracture or elbow joint effusion.
2.  Minimal calcification within the insertional fibers of the
triceps tendon.

## 2013-06-05 ENCOUNTER — Telehealth: Payer: Self-pay | Admitting: Internal Medicine

## 2013-06-06 NOTE — Telephone Encounter (Signed)
Patient advised that we will be unable to send Lialda to his mail in pharmacy as it has been over 1 year since seeing him in the office. We will need to see him for follow up before refilling. I questioned patient as to whether he was taking the Lialda at 3.6 grams daily as we have not filled the prescription since 03/22/12 (patient given #[id number] with 3 refills), so patient should have been out around 03/2013. Patient states that he has been taking the Lialda at 3.6 grams daily. He states that he is doing well and has even "toyed with the idea of stopping the Lialda altogether." I have advised him that he should not just stop this medication on his own, he needs to discuss this with one of our providers first. Patient has been scheduled to see our physician extender on 07/11/13 @ 8:30 am. Patient verbalizes understanding.

## 2013-06-11 ENCOUNTER — Encounter: Payer: Self-pay | Admitting: Nurse Practitioner

## 2013-06-11 ENCOUNTER — Ambulatory Visit (INDEPENDENT_AMBULATORY_CARE_PROVIDER_SITE_OTHER): Payer: BC Managed Care – PPO | Admitting: Nurse Practitioner

## 2013-06-11 ENCOUNTER — Other Ambulatory Visit: Payer: BC Managed Care – PPO

## 2013-06-11 VITALS — BP 120/78 | HR 64 | Ht 60.75 in | Wt 183.4 lb

## 2013-06-11 DIAGNOSIS — K5190 Ulcerative colitis, unspecified, without complications: Secondary | ICD-10-CM

## 2013-06-11 MED ORDER — MESALAMINE 1.2 G PO TBEC: DELAYED_RELEASE_TABLET | ORAL | Status: DC

## 2013-06-11 NOTE — Patient Instructions (Addendum)
I sent the prescription for the Lialda, take 1 tab 3 times daily. I sent the 90 day prescription to [facility]. Please go to the basement level to have your labs drawn.  We will call you with the results.

## 2013-06-11 NOTE — Progress Notes (Signed)
  History of Present Illness:  Patient is a 57 year old male known to Dr. Juanda Chance for a history of left sided ulcerative colitis (proctitis) diagnosed 2007. 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 He has been maintained on Lialda but comes to discuss discontinuation of the medication. He attributes hair loss to mesalamine. BMs are okay, having 1-2 solid BMs  A day. He does have occasional rectal bleeding usually associated with increased physical activity. Otherwise feels good.   Current Medications, Allergies, Past Medical History, Past Surgical History, Family History and Social History were reviewed in [employer] record.  Physical Exam: General: Well developed , white male male in no acute distress Head: Normocephalic and atraumatic Eyes:  sclerae anicteric, conjunctiva pink  Ears: Normal auditory acuity Lungs: Clear throughout to auscultation Heart: Regular rate and rhythm Abdomen: Soft, non distended, non-tender. No masses, no hepatomegaly. Normal bowel sounds Musculoskeletal: Symmetrical with no gross deformities  Extremities: No edema  Neurological: Alert oriented x 4, grossly nonfocal Psychological:  Alert and cooperative. 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 We discussed drawbacks to discontinuation of meds such as flare symptoms and increased risk of colorectal cancer, though overall risk of colon cancer is less with ulcerative proctitis then for more extensive disease.  Recommended Hepatitis A and B serologies as well as pneumovax. Patient declnes pneumovax but will have hepatitis serologies drawn. Lialda refilled. Patient will call in 2-3 months with a condition update, I suspect he will discontinue treatment. He should have surveillance colonoscopy around 2015.

## 2013-06-11 NOTE — Progress Notes (Signed)
Reviewed, I recommend for him to reduce the dose to 1.2 gm/day x 1 month before stopping altogether.

## 2013-06-12 LAB — HEPATITIS A ANTIBODY, TOTAL: Hep A Total Ab: NEGATIVE

## 2013-06-12 LAB — HEPATITIS B SURFACE ANTIGEN: Hepatitis B Surface Ag: NEGATIVE

## 2013-06-12 LAB — HEPATITIS B SURFACE ANTIBODY,QUALITATIVE: Hep B S Ab: NEGATIVE

## 2013-06-15 ENCOUNTER — Telehealth: Payer: Self-pay | Admitting: Nurse Practitioner

## 2013-06-15 NOTE — Telephone Encounter (Signed)
Spoke to pt and advised him I have some samples of Lialda to hold him over until his mail order meds come int.  He was very Adult nurse.

## 2013-08-09 ENCOUNTER — Other Ambulatory Visit: Payer: Self-pay

## 2014-11-14 ENCOUNTER — Other Ambulatory Visit: Payer: BLUE CROSS/BLUE SHIELD | Admitting: Internal Medicine

## 2014-11-14 DIAGNOSIS — Z0000 Encounter for general adult medical examination without abnormal findings: Secondary | ICD-10-CM

## 2014-11-14 DIAGNOSIS — E785 Hyperlipidemia, unspecified: Secondary | ICD-10-CM

## 2014-11-14 DIAGNOSIS — Z125 Encounter for screening for malignant neoplasm of prostate: Secondary | ICD-10-CM

## 2014-11-14 DIAGNOSIS — Z13220 Encounter for screening for lipoid disorders: Secondary | ICD-10-CM

## 2014-11-14 LAB — CBC WITH DIFFERENTIAL/PLATELET
BASOS ABS: 0 10*3/uL (ref 0.0–0.1)
BASOS PCT: 0 % (ref 0–1)
EOS ABS: 0.2 10*3/uL (ref 0.0–0.7)
Eosinophils Relative: 3 % (ref 0–5)
HCT: 46.3 % (ref 39.0–52.0)
HEMOGLOBIN: 15.6 g/dL (ref 13.0–17.0)
LYMPHS ABS: 2.1 10*3/uL (ref 0.7–4.0)
Lymphocytes Relative: 42 % (ref 12–46)
MCH: 31.1 pg (ref 26.0–34.0)
MCHC: 33.7 g/dL (ref 30.0–36.0)
MCV: 92.2 fL (ref 78.0–100.0)
MPV: 9.4 fL (ref 8.6–12.4)
Monocytes Absolute: 0.5 10*3/uL (ref 0.1–1.0)
Monocytes Relative: 9 % (ref 3–12)
NEUTROS PCT: 46 % (ref 43–77)
Neutro Abs: 2.3 10*3/uL (ref 1.7–7.7)
PLATELETS: 296 10*3/uL (ref 150–400)
RBC: 5.02 MIL/uL (ref 4.22–5.81)
RDW: 13.2 % (ref 11.5–15.5)
WBC: 5.1 10*3/uL (ref 4.0–10.5)

## 2014-11-14 LAB — COMPREHENSIVE METABOLIC PANEL
ALBUMIN: 4.3 g/dL (ref 3.5–5.2)
ALT: 23 U/L (ref 0–53)
AST: 22 U/L (ref 0–37)
Alkaline Phosphatase: 47 U/L (ref 39–117)
BUN: 19 mg/dL (ref 6–23)
CO2: 28 meq/L (ref 19–32)
Calcium: 9.7 mg/dL (ref 8.4–10.5)
Chloride: 102 mEq/L (ref 96–112)
Creat: 1.04 mg/dL (ref 0.50–1.35)
GLUCOSE: 103 mg/dL — AB (ref 70–99)
POTASSIUM: 4.1 meq/L (ref 3.5–5.3)
SODIUM: 139 meq/L (ref 135–145)
TOTAL PROTEIN: 7.1 g/dL (ref 6.0–8.3)
Total Bilirubin: 1.5 mg/dL — ABNORMAL HIGH (ref 0.2–1.2)

## 2014-11-14 NOTE — Addendum Note (Signed)
Addended by: Thomasena EdisWILLIAMS, Keshana Klemz N on: 11/14/2014 09:33 AM   Modules accepted: Orders

## 2014-11-15 ENCOUNTER — Other Ambulatory Visit: Payer: Self-pay | Admitting: Internal Medicine

## 2014-11-15 LAB — PSA: PSA: 3.07 ng/mL (ref <=4.00)

## 2014-11-16 LAB — NMR LIPOPROFILE WITH LIPIDS
CHOLESTEROL, TOTAL: 339 mg/dL — AB (ref 100–199)
HDL Particle Number: 26.2 umol/L — ABNORMAL LOW (ref >=30.5)
HDL SIZE: 8.3 nm — AB (ref >=9.2)
HDL-C: 43 mg/dL (ref >39)
LARGE VLDL-P: 2 nmol/L (ref <=2.7)
LDL CALC: 257 mg/dL — AB (ref 0–99)
LDL Particle Number: >3500 nmol/L — ABNORMAL HIGH (ref <1000)
LDL Size: 20 nm (ref >=20.8)
LP-IR SCORE: 61 — AB (ref <=45)
Large HDL-P: <1.3 umol/L — ABNORMAL LOW (ref >=4.8)
Triglycerides: 197 mg/dL — ABNORMAL HIGH (ref 0–149)
VLDL Size: 40.4 nm (ref <=46.6)

## 2014-11-18 ENCOUNTER — Encounter: Payer: Self-pay | Admitting: Internal Medicine

## 2014-12-03 ENCOUNTER — Ambulatory Visit (INDEPENDENT_AMBULATORY_CARE_PROVIDER_SITE_OTHER): Payer: BLUE CROSS/BLUE SHIELD | Admitting: Internal Medicine

## 2014-12-03 ENCOUNTER — Encounter: Payer: Self-pay | Admitting: Internal Medicine

## 2014-12-03 VITALS — BP 110/74 | HR 64 | Temp 98.0°F | Wt 192.0 lb

## 2014-12-03 DIAGNOSIS — Z0000 Encounter for general adult medical examination without abnormal findings: Secondary | ICD-10-CM | POA: Diagnosis not present

## 2014-12-03 DIAGNOSIS — E785 Hyperlipidemia, unspecified: Secondary | ICD-10-CM | POA: Diagnosis not present

## 2014-12-03 DIAGNOSIS — K5190 Ulcerative colitis, unspecified, without complications: Secondary | ICD-10-CM

## 2014-12-03 DIAGNOSIS — E782 Mixed hyperlipidemia: Secondary | ICD-10-CM | POA: Diagnosis not present

## 2014-12-03 DIAGNOSIS — E7800 Pure hypercholesterolemia, unspecified: Secondary | ICD-10-CM

## 2014-12-03 DIAGNOSIS — Z8249 Family history of ischemic heart disease and other diseases of the circulatory system: Secondary | ICD-10-CM

## 2014-12-03 LAB — POCT URINALYSIS DIPSTICK
Bilirubin, UA: NEGATIVE
Blood, UA: NEGATIVE
Glucose, UA: NEGATIVE
Ketones, UA: NEGATIVE
LEUKOCYTES UA: NEGATIVE
Nitrite, UA: NEGATIVE
PH UA: 6
PROTEIN UA: NEGATIVE
Spec Grav, UA: 1.015
UROBILINOGEN UA: NEGATIVE

## 2014-12-04 ENCOUNTER — Telehealth: Payer: Self-pay | Admitting: *Deleted

## 2014-12-04 NOTE — Telephone Encounter (Signed)
Spoke with patient he is scheduled for Carotid Doppler March 7,2016 at 2:00 PM [address] His appt with Cardiology is March 29th 2016 at 8:00 am with Dr Carolanne Grumblingracy Turner. Patient is to arrive 15 min prior to each appt.

## 2014-12-09 ENCOUNTER — Ambulatory Visit (HOSPITAL_COMMUNITY): Payer: BLUE CROSS/BLUE SHIELD | Attending: Internal Medicine | Admitting: Cardiology

## 2014-12-09 ENCOUNTER — Telehealth: Payer: Self-pay | Admitting: *Deleted

## 2014-12-09 DIAGNOSIS — I6523 Occlusion and stenosis of bilateral carotid arteries: Secondary | ICD-10-CM | POA: Insufficient documentation

## 2014-12-09 DIAGNOSIS — R42 Dizziness and giddiness: Secondary | ICD-10-CM | POA: Insufficient documentation

## 2014-12-09 DIAGNOSIS — E785 Hyperlipidemia, unspecified: Secondary | ICD-10-CM

## 2014-12-09 NOTE — Telephone Encounter (Signed)
Had to change diagnosis

## 2014-12-09 NOTE — Progress Notes (Signed)
Carotid duplex performed 

## 2014-12-10 ENCOUNTER — Telehealth: Payer: Self-pay | Admitting: *Deleted

## 2014-12-10 NOTE — Telephone Encounter (Signed)
Reviewed carotid dopplar results with patient. Given instructions to take statins as ordered.

## 2014-12-28 NOTE — Patient Instructions (Signed)
Patient is going to have carotid artery study at his request. He has no carotid bruits. I really would like for him to start statin medication but he refuses to do so. Return in one year or as needed.

## 2014-12-28 NOTE — Progress Notes (Signed)
Subjective:    Patient ID: Dave ForehandGregory Dulworth, male    DOB: 10-20-1955, 59 y.o.   MRN: [id number]  HPI 59 year old White Male with history of ulcerative colitis in today for health maintenance exam and evaluation of medical issues. Last flareup of ulcerative colitis was in the Spring of 2013. Has been treated by Dr. Juanda ChanceBrodie. Initially was treated with Imuran and Asacol. Also has been prescribed Canasa suppositories. Had screening colonoscopy at age 59 in February 2007 which was normal. Subsequently patient had rectal bleeding in August 2007 and by October 2007 he had rectal bleeding and blood mixed with mucus almost with every bowel movement. In October 2007 a flexible sigmoidoscopy was done and biopsies were consistent with proctitis and ulcerative colitis. No dysplasia identified.  He has a history of hyperlipidemia and refuses to be on statin medication.  In 2012, Dr. 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   Additional past medical history: Tonsillectomy in 1973. Appendectomy 1984. In 1999 he struck his elbow on a window ledge and developed a right olecranon bursitis. [facility]. In 2007 he had a negative Cardiolite study. In 2006 he saw Dr. Teressa SenterSypher for right shoulder discomfort and was diagnosed with primary tendinopathy of right rotator cuff. Laceration requiring sutures in right index finger 1979. History of tonsillectomy and wisdom teeth extraction.  Social history: He is married and works for [employer]. He has a Event organiserMasters degree. Does not smoke. Drinks be her. 3 children.  Family history: Father with history of thoracic aortic aneurysm and coronary artery disease status post CABG 4. One brother who is a smoker recently diagnosed with carotid artery disease. One sister in good health.    Review of Systems  Constitutional: Negative.   All other systems reviewed and are negative.      Objective:   Physical Exam  Constitutional: He is  oriented to person, place, and time. He appears well-developed and well-nourished. No distress.  HENT:  Head: Normocephalic and atraumatic.  Right Ear: External ear normal.  Left Ear: External ear normal.  Mouth/Throat: Oropharynx is clear and moist. No oropharyngeal exudate.  Eyes: Conjunctivae and EOM are normal. Pupils are equal, round, and reactive to light. Right eye exhibits no discharge. Left eye exhibits no discharge. No scleral icterus.  Neck: Neck supple. No JVD present. No thyromegaly present.  Cardiovascular: Normal rate, regular rhythm and normal heart sounds.   No murmur heard. No carotid bruits  Pulmonary/Chest: Effort normal and breath sounds normal. No respiratory distress. He has no rales. He exhibits no tenderness.  Abdominal: Soft. Bowel sounds are normal. He exhibits no distension and no mass. There is no tenderness. There is no rebound and no guarding.  Genitourinary: Prostate normal.  Musculoskeletal: He exhibits no edema.  Lymphadenopathy:    He has no cervical adenopathy.  Neurological: He is alert and oriented to person, place, and time. He has normal reflexes. No cranial nerve deficit. Coordination normal.  Skin: Skin is warm and dry. No rash noted. He is not diaphoretic.  Psychiatric: He has a normal mood and affect. His behavior is normal. Judgment and thought content normal.  Vitals reviewed.         Assessment & Plan:  History of ulcerative colitis  Mixed hyperlipidemia  Family history of heart disease  History of alopecia areata  Plan: Please trolley consider statin therapy for hyperlipidemia. Return in one year or as needed. Please contact Dr. Juanda ChanceBrodie regarding repeat colonoscopy. Last one documented  was 2007.

## 2014-12-30 ENCOUNTER — Telehealth: Payer: Self-pay | Admitting: *Deleted

## 2014-12-30 DIAGNOSIS — Z8719 Personal history of other diseases of the digestive system: Secondary | ICD-10-CM

## 2014-12-30 NOTE — Telephone Encounter (Signed)
Referral for colonoscopy placed. ?

## 2014-12-31 ENCOUNTER — Ambulatory Visit: Payer: Self-pay | Admitting: Cardiology

## 2016-12-31 DIAGNOSIS — H6981 Other specified disorders of Eustachian tube, right ear: Secondary | ICD-10-CM | POA: Diagnosis not present

## 2017-03-17 ENCOUNTER — Other Ambulatory Visit: Payer: BLUE CROSS/BLUE SHIELD | Admitting: Internal Medicine

## 2017-03-17 DIAGNOSIS — Z125 Encounter for screening for malignant neoplasm of prostate: Secondary | ICD-10-CM

## 2017-03-17 DIAGNOSIS — Z0000 Encounter for general adult medical examination without abnormal findings: Secondary | ICD-10-CM | POA: Diagnosis not present

## 2017-03-17 DIAGNOSIS — Z130 Encounter for screening for diseases of the blood and blood-forming organs and certain disorders involving the immune mechanism: Secondary | ICD-10-CM | POA: Diagnosis not present

## 2017-03-17 DIAGNOSIS — E785 Hyperlipidemia, unspecified: Secondary | ICD-10-CM | POA: Diagnosis not present

## 2017-03-17 LAB — CBC WITH DIFFERENTIAL/PLATELET
BASOS ABS: 34 {cells}/uL (ref 0–200)
Basophils Relative: 1 %
EOS PCT: 3 %
Eosinophils Absolute: 102 cells/uL (ref 15–500)
HEMATOCRIT: 45 % (ref 38.5–50.0)
HEMOGLOBIN: 14.8 g/dL (ref 13.2–17.1)
LYMPHS ABS: 1360 {cells}/uL (ref 850–3900)
Lymphocytes Relative: 40 %
MCH: 31 pg (ref 27.0–33.0)
MCHC: 32.9 g/dL (ref 32.0–36.0)
MCV: 94.3 fL (ref 80.0–100.0)
MONO ABS: 476 {cells}/uL (ref 200–950)
MPV: 9.3 fL (ref 7.5–12.5)
Monocytes Relative: 14 %
NEUTROS ABS: 1428 {cells}/uL — AB (ref 1500–7800)
Neutrophils Relative %: 42 %
Platelets: 271 10*3/uL (ref 140–400)
RBC: 4.77 MIL/uL (ref 4.20–5.80)
RDW: 13 % (ref 11.0–15.0)
WBC: 3.4 10*3/uL — ABNORMAL LOW (ref 3.8–10.8)

## 2017-03-17 LAB — COMPLETE METABOLIC PANEL WITH GFR
ALBUMIN: 4.2 g/dL (ref 3.6–5.1)
ALK PHOS: 48 U/L (ref 40–115)
ALT: 19 U/L (ref 9–46)
AST: 22 U/L (ref 10–35)
BILIRUBIN TOTAL: 1.9 mg/dL — AB (ref 0.2–1.2)
BUN: 17 mg/dL (ref 7–25)
CALCIUM: 9.4 mg/dL (ref 8.6–10.3)
CO2: 26 mmol/L (ref 20–31)
Chloride: 103 mmol/L (ref 98–110)
Creat: 1.01 mg/dL (ref 0.70–1.25)
GFR, Est African American: >89 mL/min (ref >=60)
GFR, Est Non African American: 79 mL/min (ref >=60)
GLUCOSE: 95 mg/dL (ref 65–99)
POTASSIUM: 4.3 mmol/L (ref 3.5–5.3)
SODIUM: 139 mmol/L (ref 135–146)
TOTAL PROTEIN: 6.6 g/dL (ref 6.1–8.1)

## 2017-03-17 LAB — LIPID PANEL
CHOLESTEROL: 315 mg/dL — AB (ref <200)
HDL: 61 mg/dL (ref >40)
LDL Cholesterol: 241 mg/dL — ABNORMAL HIGH (ref <100)
Total CHOL/HDL Ratio: 5.2 Ratio — ABNORMAL HIGH (ref <5.0)
Triglycerides: 63 mg/dL (ref <150)
VLDL: 13 mg/dL (ref <30)

## 2017-03-18 LAB — PSA: PSA: 2.8 ng/mL (ref <=4.0)

## 2017-03-21 ENCOUNTER — Ambulatory Visit (INDEPENDENT_AMBULATORY_CARE_PROVIDER_SITE_OTHER): Payer: BLUE CROSS/BLUE SHIELD | Admitting: Internal Medicine

## 2017-03-21 ENCOUNTER — Encounter: Payer: Self-pay | Admitting: Internal Medicine

## 2017-03-21 VITALS — BP 128/80 | HR 58 | Ht 71.0 in | Wt 178.0 lb

## 2017-03-21 DIAGNOSIS — Z8249 Family history of ischemic heart disease and other diseases of the circulatory system: Secondary | ICD-10-CM

## 2017-03-21 DIAGNOSIS — Z8719 Personal history of other diseases of the digestive system: Secondary | ICD-10-CM

## 2017-03-21 DIAGNOSIS — Z0000 Encounter for general adult medical examination without abnormal findings: Secondary | ICD-10-CM

## 2017-03-21 DIAGNOSIS — E7800 Pure hypercholesterolemia, unspecified: Secondary | ICD-10-CM | POA: Diagnosis not present

## 2017-03-21 LAB — POCT URINALYSIS DIPSTICK
Bilirubin, UA: NEGATIVE
GLUCOSE UA: NEGATIVE
Ketones, UA: NEGATIVE
Leukocytes, UA: NEGATIVE
NITRITE UA: NEGATIVE
PROTEIN UA: NEGATIVE
RBC UA: NEGATIVE
SPEC GRAV UA: 1.015 (ref 1.010–1.025)
UROBILINOGEN UA: 0.2 U/dL
pH, UA: 7 (ref 5.0–8.0)

## 2017-03-21 NOTE — Progress Notes (Signed)
   Subjective:    Patient ID: [name], male    DOB: 07/25/1956, 61 y.o.   MRN: [id number]  HPI  61 year old White Male for health maintenanceExam and evaluation of medical issues. He has a history of ulcerative colitis that was diagnosed by Dr. Juanda ChanceBrodie in 2007. Initially was treated with Imuran and as a call. He had a screening colonoscopy at age 61 which was normal. This was prior to onset of rectal bleeding in August 2007. He has since stopped taking these medications and says he is asymptomatic.  History of hyperlipidemia and refuses to be on statin medication.  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  Additional past medical history: Tonsillectomy 1973, appendectomy 1984. In 1999, he struck his elbow on a window ledge and developed right olecranon bursitis.  In 2007 he had a negative Cardiolite study.  In 2006 he saw Dr. Teressa SenterSypher for right shoulder discomfort and was diagnosed with primary tendinopathy of the right rotator cuff.  Had laceration requiring sutures in right index finger 1979.  History of tonsillectomy and wisdom teeth extraction.  Social history: He is married. Retired from [employer] and now is working as a Research scientist (medical)consultant. He has a Event organiserMasters degree. Does not smoke. Drinks beer. 3 children. Wife is also retired from [employer].  Family history: Father with history of thoracic aortic aneurysm and coronary artery disease status post CABG 4. One brother with history of smoking diagnosed with coronary artery disease. One sister in good health.  Patient had carotid Dopplers 2016 showing a 1-39% bilateral ICA stenosis. He does not want to have repeat study at this time.    Review of Systems right 4 th trigger finger       Objective:   Physical Exam  Constitutional: He is oriented to person, place, and time. He appears well-developed and well-nourished.  HENT:  Head: Normocephalic and atraumatic.  Neck: Neck supple. No JVD present. No  thyromegaly present.  Abdominal: Soft. Bowel sounds are normal.  Lymphadenopathy:    He has no cervical adenopathy.  Neurological: He is alert and oriented to person, place, and time. He has normal reflexes. No cranial nerve deficit. Coordination normal.  Skin: Skin is warm and dry. No rash noted.  Psychiatric: He has a normal mood and affect. His behavior is normal. Judgment and thought content normal.  Vitals reviewed.         Assessment & Plan:  Significant hyperlipidemia. Total cholesterol was 3:15 with an LDL cholesterol of 241 with normal triglycerides. HDL cholesterol was 61. Refuses statin medication  Remainder of lab work is normal with the exception of bilirubin of 1.9  Family history of coronary disease  History of ulcerative colitis  Plan: Reminded patient he needs repeat colonoscopy. He refuses statin therapy. Return in one year or as needed.

## 2017-04-02 NOTE — Patient Instructions (Signed)
Please consider statin medication for hyperlipidemia. Please have colonoscopy. Return in one year or as needed.

## 2017-11-17 ENCOUNTER — Encounter: Payer: Self-pay | Admitting: Internal Medicine

## 2017-11-17 ENCOUNTER — Ambulatory Visit (INDEPENDENT_AMBULATORY_CARE_PROVIDER_SITE_OTHER): Payer: BLUE CROSS/BLUE SHIELD | Admitting: Internal Medicine

## 2017-11-17 VITALS — BP 128/80 | HR 84 | Temp 99.8°F | Wt 184.0 lb

## 2017-11-17 DIAGNOSIS — J0100 Acute maxillary sinusitis, unspecified: Secondary | ICD-10-CM

## 2017-11-17 DIAGNOSIS — R509 Fever, unspecified: Secondary | ICD-10-CM | POA: Diagnosis not present

## 2017-11-17 DIAGNOSIS — R6883 Chills (without fever): Secondary | ICD-10-CM

## 2017-11-17 DIAGNOSIS — B349 Viral infection, unspecified: Secondary | ICD-10-CM | POA: Diagnosis not present

## 2017-11-17 LAB — POCT INFLUENZA A/B
INFLUENZA A, POC: NEGATIVE
INFLUENZA B, POC: NEGATIVE

## 2017-11-17 LAB — POCT RAPID STREP A (OFFICE): Rapid Strep A Screen: NEGATIVE

## 2017-11-17 MED ORDER — AMOXICILLIN 500 MG PO CAPS: 500.0000 mg | ORAL_CAPSULE | Freq: Three times a day (TID) | ORAL | 0 refills | Status: DC

## 2017-11-22 ENCOUNTER — Encounter: Payer: Self-pay | Admitting: Internal Medicine

## 2017-11-22 NOTE — Patient Instructions (Signed)
Rest and drink plenty of fluids.  Tylenol as needed for fever.  Amoxicillin 500 mg 3 times daily for 10 days for sinusitis symptoms.

## 2017-11-22 NOTE — Progress Notes (Signed)
   Subjective:    Patient ID: [name], male    DOB: [birth year], 62 y.o.   MRN: [id number]  HPI 62 year old Male in today with chills and fever.  Grandchildren have been staying with him and they have been ill.  He does not generally take flu vaccine.  Has malaise and fatigue with myalgias. Rapid strep screen is negative.  Rapid test for influenza A and B both negative   Review of Systems see above     Objective:   Physical Exam Pharynx is slightly injected.  Sounds nasally congested.  TMs are clear.  Neck is supple without significant adenopathy.  Chest clear to auscultation without rales or wheezing.       Assessment & Plan:  Acute viral syndrome  Acute sinusitis  Plan: Amoxicillin 500 mg 3 times a day for 10 days.  He says he may not take this because he does not like to take antibiotics.  Call if not better in 7-10 days or sooner if worse.

## 2017-12-01 DIAGNOSIS — Z029 Encounter for administrative examinations, unspecified: Secondary | ICD-10-CM

## 2020-10-10 ENCOUNTER — Other Ambulatory Visit: Payer: Medicare Other | Admitting: Internal Medicine

## 2020-10-10 ENCOUNTER — Other Ambulatory Visit: Payer: Self-pay

## 2020-10-10 DIAGNOSIS — E7800 Pure hypercholesterolemia, unspecified: Secondary | ICD-10-CM

## 2020-10-10 DIAGNOSIS — L639 Alopecia areata, unspecified: Secondary | ICD-10-CM

## 2020-10-10 DIAGNOSIS — Z0000 Encounter for general adult medical examination without abnormal findings: Secondary | ICD-10-CM

## 2020-10-10 DIAGNOSIS — E785 Hyperlipidemia, unspecified: Secondary | ICD-10-CM

## 2020-10-10 DIAGNOSIS — L210 Seborrhea capitis: Secondary | ICD-10-CM

## 2020-10-10 DIAGNOSIS — Z125 Encounter for screening for malignant neoplasm of prostate: Secondary | ICD-10-CM

## 2020-10-10 LAB — CBC WITH DIFFERENTIAL/PLATELET
Absolute Monocytes: 465 cells/uL (ref 200–950)
Basophils Absolute: 40 cells/uL (ref 0–200)
Basophils Relative: 0.8 %
MCHC: 34.2 g/dL (ref 32.0–36.0)
Neutrophils Relative %: 59.9 %
Platelets: 293 10*3/uL (ref 140–400)
Total Lymphocyte: 27.4 %
WBC: 5 10*3/uL (ref 3.8–10.8)

## 2020-10-11 LAB — COMPLETE METABOLIC PANEL WITH GFR
AG Ratio: 1.7 (calc) (ref 1.0–2.5)
ALT: 17 U/L (ref 9–46)
AST: 16 U/L (ref 10–35)
Albumin: 4.5 g/dL (ref 3.6–5.1)
Alkaline phosphatase (APISO): 54 U/L (ref 35–144)
BUN: 12 mg/dL (ref 7–25)
CO2: 29 mmol/L (ref 20–32)
Calcium: 9.9 mg/dL (ref 8.6–10.3)
Chloride: 104 mmol/L (ref 98–110)
Creat: 0.93 mg/dL (ref 0.70–1.25)
GFR, Est African American: 99 mL/min/{1.73_m2} (ref >=60)
GFR, Est Non African American: 85 mL/min/{1.73_m2} (ref >=60)
Globulin: 2.6 g/dL (calc) (ref 1.9–3.7)
Glucose, Bld: 106 mg/dL — ABNORMAL HIGH (ref 65–99)
Potassium: 4.5 mmol/L (ref 3.5–5.3)
Sodium: 140 mmol/L (ref 135–146)
Total Bilirubin: 1 mg/dL (ref 0.2–1.2)
Total Protein: 7.1 g/dL (ref 6.1–8.1)

## 2020-10-11 LAB — CBC WITH DIFFERENTIAL/PLATELET
Eosinophils Absolute: 130 cells/uL (ref 15–500)
Eosinophils Relative: 2.6 %
HCT: 46.8 % (ref 38.5–50.0)
Hemoglobin: 16 g/dL (ref 13.2–17.1)
Lymphs Abs: 1370 cells/uL (ref 850–3900)
MCH: 31.4 pg (ref 27.0–33.0)
MCV: 91.8 fL (ref 80.0–100.0)
MPV: 10.1 fL (ref 7.5–12.5)
Monocytes Relative: 9.3 %
Neutro Abs: 2995 cells/uL (ref 1500–7800)
RBC: 5.1 10*6/uL (ref 4.20–5.80)
RDW: 12.4 % (ref 11.0–15.0)

## 2020-10-11 LAB — LIPID PANEL
Cholesterol: 337 mg/dL — ABNORMAL HIGH (ref <200)
HDL: 42 mg/dL (ref >=40)
LDL Cholesterol (Calc): 262 mg/dL (calc) — ABNORMAL HIGH
Non-HDL Cholesterol (Calc): 295 mg/dL (calc) — ABNORMAL HIGH (ref <130)
Total CHOL/HDL Ratio: 8 (calc) — ABNORMAL HIGH (ref <5.0)
Triglycerides: 160 mg/dL — ABNORMAL HIGH (ref <150)

## 2020-10-11 LAB — PSA: PSA: 3.02 ng/mL (ref <=4.0)

## 2020-10-16 ENCOUNTER — Ambulatory Visit (INDEPENDENT_AMBULATORY_CARE_PROVIDER_SITE_OTHER): Payer: Medicare Other | Admitting: Internal Medicine

## 2020-10-16 ENCOUNTER — Other Ambulatory Visit: Payer: Self-pay

## 2020-10-16 ENCOUNTER — Encounter: Payer: Self-pay | Admitting: Internal Medicine

## 2020-10-16 VITALS — BP 110/70 | HR 86 | Ht 71.0 in | Wt 187.0 lb

## 2020-10-16 DIAGNOSIS — Z0000 Encounter for general adult medical examination without abnormal findings: Secondary | ICD-10-CM | POA: Diagnosis not present

## 2020-10-16 DIAGNOSIS — K625 Hemorrhage of anus and rectum: Secondary | ICD-10-CM | POA: Diagnosis not present

## 2020-10-16 DIAGNOSIS — E782 Mixed hyperlipidemia: Secondary | ICD-10-CM

## 2020-10-16 DIAGNOSIS — Z8719 Personal history of other diseases of the digestive system: Secondary | ICD-10-CM

## 2020-10-16 LAB — POCT URINALYSIS DIPSTICK
Appearance: NEGATIVE
Bilirubin, UA: NEGATIVE
Blood, UA: NEGATIVE
Glucose, UA: NEGATIVE
Ketones, UA: NEGATIVE
Leukocytes, UA: NEGATIVE
Nitrite, UA: NEGATIVE
Odor: NEGATIVE
Protein, UA: NEGATIVE
Spec Grav, UA: 1.01 (ref 1.010–1.025)
Urobilinogen, UA: 0.2 E.U./dL
pH, UA: 6.5 (ref 5.0–8.0)

## 2020-10-16 NOTE — Progress Notes (Signed)
Subjective:    Patient ID: [name], male    DOB: 05/26/1956, 65 y.o.   MRN: [id number]  HPI 65 year old Male for [facility] health maintenance exam and evaluation of medical issues.    Patient last seen for health maintenance exam in June 2018.  He has a history of ulcerative colitis diagnosed by Dr. [name] in 2007.  Initially was treated with Imuran and Asacol. This began as rectal bleeding in August 2007.    He has since stopped taking these medications and says up until recently he was asymptomatic but recently noticed a slight amount of rectal bleeding.  History of hyperlipidemia and refuses to be on statin medication.  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  In 2007 he had a negative Cardiolite study.  In 2006 he saw Dr. Teressa Senter for right shoulder discomfort and was diagnosed with primary tendinopathy of right rotator cuff.  And laceration requiring sutures and right index finger 1979.  Had tonsillectomy 1973, appendectomy 1994.  In 1999 he struck his elbow on a window ledge and developed right olecranon bursitis.  Social history.  He is married.  Retired from [employer] and now is working as a Research scientist (medical).  He has a Event organiser.  Does not smoke.  Drinks beer.  Has 3 children.  Wife also retired from [employer].  Patient had carotid Dopplers in 2016 showing a 1 - 39% bilateral ICA stenosis. Patient has not wanted to have repeat study.  Family history: Father with history of thoracic aortic aneurysm and coronary artery disease status post CABG x4.  1 brother with history of smoking diagnosed with coronary artery disease.  1 sister in good health.  I recommended that he might need calcium scoring/CT with family history of aortic aneurysm and coronary artery disease in addition to personal history of hyperlipidemia.       Review of Systems only complaint is some mild bright red blood per rectum recently.  He thinks it  might be hemorrhoids.     Objective:   Physical Exam  Vital signs reviewed.  Skin warm and dry.  No cervical adenopathy.  No carotid bruits.  Chest clear to auscultation.  Cardiac exam regular rate and rhythm normal S1 and S2 without murmurs or gallops.  Abdomen soft nondistended without hepatosplenomegaly masses or tenderness.  Rectal exam: Prostate is normal.  No blood on exam.  No rectal fissures appreciated.      Assessment & Plan:  History of ulcerative colitis with recent recurrence of rectal bleeding.  Recommend colonoscopy.  Referral will be made.  Last colonoscopy on file was February 2007.  Hyperlipidemia-he does not want to be on statin medication.  Family history of heart disease-would benefit from CT cardiac scoring. Suggested Cardiology consult but patient prefers to defer this at this time.  History of mild carotid disease based on Dopplers March 2016 need repeat screening. Declines statin medication.  Plan: See above regarding rectal bleeding and referral to  Gastroenterology. Tetanus immunization is up to date. Discussed Pneumococcal 23 vaccine- he wants to defer this.  Return in 1 year or as needed.   Subjective:   Patient presents for Medicare Annual/Subsequent preventive examination.  Review Past Medical/Family/Social: see above   Risk Factors  Current exercise habits: exercises regularly Dietary issues discussed: low fat low carb discussed   Cardiac risk factors:hyperlipidemia  Depression Screen  (Note: if answer to either of the following is "Yes", a more complete depression  screening is indicated)   Over the past two weeks, have you felt down, depressed or hopeless? No  Over the past two weeks, have you felt little interest or pleasure in doing things? No Have you lost interest or pleasure in daily life? No Do you often feel hopeless? No Do you cry easily over simple problems? No   Activities of Daily Living  In your present state of health, do you  have any difficulty performing the following activities?:   Driving? No  Managing money? No  Feeding yourself? No  Getting from bed to chair? No  Climbing a flight of stairs? No  Preparing food and eating?: No  Bathing or showering? No  Getting dressed: No  Getting to the toilet? No  Using the toilet:No  Moving around from place to place: No  In the past year have you fallen or had a near fall?:yes Are you sexually active? yes Do you have more than one partner? No   Hearing Difficulties: No  Do you often ask people to speak up or repeat themselves? No  Do you experience ringing or noises in your ears? No  Do you have difficulty understanding soft or whispered voices? No  Do you feel that you have a problem with memory? No Do you often misplace items? No    Home Safety:  Do you have a smoke alarm at your residence? Yes Do you have grab bars in the bathroom? yes Do you have throw rugs in your house? yes   Cognitive Testing  Alert? Yes Normal Appearance?Yes  Oriented to person? Yes Place? Yes  Time? Yes  Recall of three objects? Yes  Can perform simple calculations? Yes  Displays appropriate judgment?Yes  Can read the correct time from a watch face?Yes   List the Names of Other Physician/Practitioners you currently use:  See referral list for the physicians patient is currently seeing.     Review of Systems: see above   Objective:     General appearance: Appears younger than stated age  Head: Normocephalic, without obvious abnormality, atraumatic  Eyes: conj clear, EOMi PEERLA  Ears: normal TM's and external ear canals both ears  Nose: Nares normal. Septum midline. Mucosa normal. No drainage or sinus tenderness.  Throat: lips, mucosa, and tongue normal; teeth and gums normal  Neck: no adenopathy, no carotid bruit, no JVD, supple, symmetrical, trachea midline and thyroid not enlarged, symmetric, no tenderness/mass/nodules  No CVA tenderness.  Lungs: clear to  auscultation bilaterally  Breasts: normal appearance, no masses or tenderness Heart: regular rate and rhythm, S1, S2 normal, no murmur, click, rub or gallop  Abdomen: soft, non-tender; bowel sounds normal; no masses, no organomegaly  Musculoskeletal: ROM normal in all joints, no crepitus, no deformity, Normal muscle strengthen. Back  is symmetric, no curvature. Skin: Skin color, texture, turgor normal. No rashes or lesions  Lymph nodes: Cervical, supraclavicular, and axillary nodes normal.  Neurologic: CN 2 -12 Normal, Normal symmetric reflexes. Normal coordination and gait  Psych: Alert & Oriented x 3, Mood appear stable.    Assessment:    Annual wellness medicare exam   Plan:    During the course of the visit the patient was educated and counseled about appropriate screening and preventive services including:     Discussed immunizations   To have Colonoscopy for rectal bleeding- GI referral placed      Patient Instructions (the written plan) was given to the patient.  Medicare Attestation  I have personally reviewed:  The  patient's medical and social history  Their use of alcohol, tobacco or illicit drugs  Their current medications and supplements  [name] functional ability including ADLs,fall risks, home safety risks, cognitive, and hearing and visual impairment  Diet and physical activities  Evidence for depression or mood disorders  [name] weight, height, BMI, and visual acuity have been recorded in the chart. I have made referrals, counseling, and provided education to the patient based on review of the above and I have provided the patient with a written personalized care plan for preventive services.

## 2020-10-20 NOTE — Patient Instructions (Signed)
Patient declines Cardiology evaluation at this time with history of hyperlipidemia and family history of coronary disease.  Regarding rectal bleeding, feel he would benefit from referral to Gastroenterology.  Needs colonoscopy.

## 2023-10-10 NOTE — Progress Notes (Signed)
 Annual Wellness Visit   Patient Care Team: Perri Dave PARAS, MD as PCP - General (Internal Medicine)  Visit Date: 10/11/23   Chief Complaint  Patient presents with   Annual Exam   Subjective:  Patient: [name], Male DOB: Nov 07, 1955, 68 y.o. MRN: [id number]  [name] is a 68 y.o. Male who presents today for his Annual Wellness Visit, last seen 10/16/2020. Patient has history of HLD, Hemorrhoids, Ulcerative Colitis, Alopecia Areata, Sebhorrhea Capitis, and Ventral Hernia.   Mentions that he'd like to have several spots on his back looked at by dermatology, as well as a couple of lipomas he has.   History of Ulcerative Colitis diagnosed by Dr. Princella Nida in 2007. Began as rectal bleeding in August 2007. Initially was treated with Imuran and Asacol , but he has since stopped taking these medications.    History of Hyperlipidemia - refuses to be on statin medication. In 2007 he had a NEGATIVE Cardiolite study. Had carotid Dopplers in 2016 showing a 1 - 39% bilateral ICA stenosis - has not wanted to have repeat study.      Colonoscopy 07/19/06 with these findings: 1. colon, 20 cm, biopsies: fragments of colonic mucosa with superficial hemorrhages in lamina propria. no active mucosal inflammation, granulomas or dysplasia identified. 2. rectum/colon, 5 cm, biopsy: active chronic mucosal proctitis consistent with ulcerative colitis. no dysplasia identified. Repeat overdue since 11/10/15. Declines colonoscopy, but is open to do Colo-guard  PSA 10/10/20 was 3.02. Reports that he has been having some issues with urinating.  Vaccine Counseling: Due for Tdap, Shingles 2/2, PNA, Flu, and Covid; UTD on Shingles 1/2.  Mentions experiencing a full body prickling rash following first Shingles vaccination. Denies fever, shaking, chills. Hx of seborrhea capitis  Past Medical History:  Diagnosis Date   Hair loss    Hemorrhoids    Hyperlipidemia    Ulcerative colitis    Ventral hernia   PMH  Narrative:  2012 Dermatologist diagnosed him with Alopecia Areata. 2006 he saw Dr. Leonor for right shoulder discomfort and was diagnosed with Primary Tendinopathy of Right Rotator Cuff.  Family History  Problem Relation Age of Onset   Heart disease Father    Colon cancer Neg Hx   Family History Narrative:  Father with history of thoracic aortic aneurysm and coronary artery disease status post CABG x4.  1 brother with history of smoking diagnosed with coronary artery disease.  1 sister in good health   Social History   Social History Narrative   He is married. 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 Does not smoke. Drinks beer. Has 3 children. Wife also retired from [employer].   Past Surgical History:  Procedure Laterality Date   APPENDECTOMY     index finger surgery     TONSILLECTOMY     WISDOM TOOTH EXTRACTION    Surgical History Narrative:  1999 - struck his elbow on window ledge, developed Right Olecranon Bursitis 1994 - Appendectomy 1979 - Laceration on Right Index Finger requiring Sutures  1973 - Tonsillectomy  Review of Systems  Constitutional:  Negative for chills, fever, malaise/fatigue and weight loss.  HENT:  Negative for hearing loss, sinus pain and sore throat.   Respiratory:  Negative for cough, hemoptysis and shortness of breath.   Cardiovascular:  Negative for chest pain, palpitations, leg swelling and PND.  Gastrointestinal:  Negative for abdominal pain, constipation, diarrhea, heartburn, nausea and vomiting.  Genitourinary:  Negative  for dysuria, frequency and urgency.       (+) Difficulty Urinating  Musculoskeletal:  Negative for back pain, myalgias and neck pain.  Skin:  Positive for rash (after recieving 1st Shringrix vaccination). Negative for itching.  Neurological:  Negative for dizziness, tingling, seizures and headaches.  Endo/Heme/Allergies:  Negative for polydipsia.  Psychiatric/Behavioral:   Negative for depression. The patient is not nervous/anxious.      Objective:  Vitals: BP 120/80   Pulse 74   Ht 6' (1.829 m)   Wt 195 lb (88.5 kg)   SpO2 97%   BMI 26.45 kg/m  Physical Exam Vitals and nursing note reviewed. Exam conducted with a chaperone present (Araceli [place], CMA).  Constitutional:      Appearance: Normal appearance. He is not ill-appearing or toxic-appearing.  HENT:     Head: Normocephalic and atraumatic.  Pulmonary:     Effort: Pulmonary effort is normal.  Genitourinary:    Prostate: Normal. Not enlarged.  Musculoskeletal:     Left hand: Normal.     Comments: Right Hand: bony prominence at the DIP joint  Skin:    General: Skin is dry.     Comments: several scattered seborrheic keratoses on his back lipoma on his back lipoma left shoulder, smaller than on his back  Neurological:     General: No focal deficit present.     Mental Status: He is alert and oriented to person, place, and time. Mental status is at baseline.  Psychiatric:        Mood and Affect: Mood normal.        Behavior: Behavior normal.        Thought Content: Thought content normal.        Judgment: Judgment normal.   Most recent fall risk assessment:    10/11/2023   10:25 AM  Fall Risk   Falls in the past year? 0  Number falls in past yr: 0  Injury with Fall? 0  Risk for fall due to : No Fall Risks  Follow up Falls evaluation completed;Education provided    Most recent depression screenings:    10/11/2023   10:32 AM 10/11/2023   10:25 AM  PHQ 2/9 Scores  PHQ - 2 Score 0 0   Most recent cognitive screening:    10/11/2023   10:30 AM  6CIT Screen  What Year? 0 points  What month? 0 points  What time? 0 points  Count back from 20 0 points  Months in reverse 0 points  Repeat phrase 0 points  Total Score 0 points   Results:  Studies obtained and personally reviewed by me:  Colonoscopy 07/19/06: 1. colon, 20 cm, biopsies: fragments of colonic mucosa with superficial  hemorrhages in lamina propria. no active mucosal inflammation, granulomas or dysplasia identified. 2. rectum/colon, 5 cm, biopsy: active chronic mucosal proctitis consistent with ulcerative colitis. no dysplasia identified.    Labs:     Component Value Date/Time   NA 140 10/10/2020 0950   K 4.5 10/10/2020 0950   CL 104 10/10/2020 0950   CO2 29 10/10/2020 0950   GLUCOSE 106 (H) 10/10/2020 0950   BUN 12 10/10/2020 0950   CREATININE 0.93 10/10/2020 0950   CALCIUM 9.9 10/10/2020 0950   PROT 7.1 10/10/2020 0950   ALBUMIN 4.2 03/17/2017 1139   AST 16 10/10/2020 0950   ALT 17 10/10/2020 0950   ALKPHOS 48 03/17/2017 1139   BILITOT 1.0 10/10/2020 0950   GFRNONAA 85 10/10/2020 0950   GFRAA 99  10/10/2020 0950   Lab Results  Component Value Date   WBC 5.0 10/10/2020   HGB 16.0 10/10/2020   HCT 46.8 10/10/2020   MCV 91.8 10/10/2020   PLT 293 10/10/2020   Lab Results  Component Value Date   CHOL 337 (H) 10/10/2020   HDL 42 10/10/2020   LDLCALC 262 (H) 10/10/2020   TRIG 160 (H) 10/10/2020   CHOLHDL 8.0 (H) 10/10/2020   Lab Results  Component Value Date   TSH 2.072 06/17/2011   Lab Results  Component Value Date   PSA 3.02 10/10/2020   PSA 2.8 03/17/2017   PSA 3.07 11/14/2014    Assessment & Plan:   Ulcerative Colitis: diagnosed by Dr. Princella Nida in 2007. Began as rectal bleeding in August 2007. Initially was treated with Imuran and Asacol , but he has since stopped taking these medications.    Hyperlipidemia - has previously refused to be on statin medication. In 2007 he had a NEGATIVE Cardiolite study. Had carotid Dopplers in 2016 showing a 1 - 39% bilateral ICA stenosis - has not wanted to have repeat study. Having CT coronary scoring study.   Colonoscopy: 07/19/06 with these findings: 1. colon, 20 cm, biopsies: fragments of colonic mucosa with superficial hemorrhages in lamina propria. no active mucosal inflammation, granulomas or dysplasia identified. 2. rectum/colon, 5 cm,  biopsy: active chronic mucosal proctitis consistent with ulcerative colitis. no dysplasia identified. Repeat overdue since 11/10/15. Declines colonoscopy, but is open to do Colo-guard - sending in Colo-guard. He will return to have his PSA rechecked.   Benign Prostatic Hyperplasia; PSA: 10/10/20 was 3.02. Prostate normal on examination but he reports that he has been having some issues with urinating. - sending in Flomax  0.4 mg daily for BPH. Recommended follow-up with urology  Seborrheic Keratoses; Lipoma(s): several scattered seborrheic keratoses on his back, and a lipoma on his back and left shoulder - he would like to see Rolan Molt with [facility] and will contact them to make an appointment  Arthritis (Right Hand): bony prominence at the DIP joint - recommended to see orthopedics   Vaccine Counseling: Due for Tdap, Shingles 2/2, PNA, Flu, and Covid; UTD on Shingles 1/2. Mentions experiencing a full body prickling rash following receiving his first Shingles vaccination - he will contact his pharmacy about this reaction   Return for fasting labs on 10/13/23    Annual wellness visit done today including the all of the following: Reviewed patient's Family Medical History Reviewed and updated list of patient's medical providers Assessment of cognitive impairment was done Assessed patient's functional ability Established a written schedule for health screening services Health Risk Assessent Completed and Reviewed  Discussed health benefits of physical activity, and encouraged him to engage in regular exercise appropriate for his age and condition.        I,Emily Lagle,acting as a neurosurgeon for Dave JINNY Hailstone, MD.,have documented all relevant documentation on the behalf of Dave JINNY Hailstone, MD,as directed by  Dave JINNY Hailstone, MD while in the presence of Dave JINNY Hailstone, MD.   I, Dave JINNY Hailstone, MD, have reviewed all documentation for this visit. The documentation on 10/29/23 for the exam,  diagnosis, procedures, and orders are all accurate and complete.

## 2023-10-11 ENCOUNTER — Encounter: Payer: Self-pay | Admitting: Internal Medicine

## 2023-10-11 ENCOUNTER — Ambulatory Visit: Payer: Medicare Other | Admitting: Internal Medicine

## 2023-10-11 VITALS — BP 120/80 | HR 74 | Ht 72.0 in | Wt 195.0 lb

## 2023-10-11 DIAGNOSIS — Z1211 Encounter for screening for malignant neoplasm of colon: Secondary | ICD-10-CM

## 2023-10-11 DIAGNOSIS — M19041 Primary osteoarthritis, right hand: Secondary | ICD-10-CM

## 2023-10-11 DIAGNOSIS — R350 Frequency of micturition: Secondary | ICD-10-CM

## 2023-10-11 DIAGNOSIS — L821 Other seborrheic keratosis: Secondary | ICD-10-CM

## 2023-10-11 DIAGNOSIS — N401 Enlarged prostate with lower urinary tract symptoms: Secondary | ICD-10-CM | POA: Diagnosis not present

## 2023-10-11 DIAGNOSIS — D171 Benign lipomatous neoplasm of skin and subcutaneous tissue of trunk: Secondary | ICD-10-CM | POA: Diagnosis not present

## 2023-10-11 DIAGNOSIS — E782 Mixed hyperlipidemia: Secondary | ICD-10-CM

## 2023-10-11 DIAGNOSIS — Z0000 Encounter for general adult medical examination without abnormal findings: Secondary | ICD-10-CM | POA: Diagnosis not present

## 2023-10-11 LAB — IFOBT (OCCULT BLOOD): IFOBT: NEGATIVE

## 2023-10-11 MED ORDER — TAMSULOSIN HCL 0.4 MG PO CAPS: 0.4000 mg | ORAL_CAPSULE | Freq: Every day | ORAL | 0 refills | Status: DC

## 2023-10-13 ENCOUNTER — Other Ambulatory Visit: Payer: Medicare Other

## 2023-10-13 DIAGNOSIS — Z0000 Encounter for general adult medical examination without abnormal findings: Secondary | ICD-10-CM

## 2023-10-13 DIAGNOSIS — N401 Enlarged prostate with lower urinary tract symptoms: Secondary | ICD-10-CM

## 2023-10-13 DIAGNOSIS — Z125 Encounter for screening for malignant neoplasm of prostate: Secondary | ICD-10-CM

## 2023-10-13 DIAGNOSIS — E785 Hyperlipidemia, unspecified: Secondary | ICD-10-CM

## 2023-10-13 DIAGNOSIS — L821 Other seborrheic keratosis: Secondary | ICD-10-CM

## 2023-10-13 DIAGNOSIS — M19041 Primary osteoarthritis, right hand: Secondary | ICD-10-CM

## 2023-10-13 DIAGNOSIS — D171 Benign lipomatous neoplasm of skin and subcutaneous tissue of trunk: Secondary | ICD-10-CM

## 2023-10-17 ENCOUNTER — Ambulatory Visit: Payer: Self-pay | Admitting: Internal Medicine

## 2023-10-17 ENCOUNTER — Other Ambulatory Visit: Payer: Self-pay | Admitting: Internal Medicine

## 2023-10-17 DIAGNOSIS — R7309 Other abnormal glucose: Secondary | ICD-10-CM

## 2023-10-17 NOTE — Telephone Encounter (Signed)
  Chief Complaint: cough Symptoms: cough, body aches Frequency: Friday Pertinent Negatives: Patient denies fever, sob Disposition: [] ED /[] Urgent Care (no appt availability in office) / [] Appointment(In office/virtual)/ []  [facility]/ [x] Home Care/ [] Refused Recommended Disposition /[] [facility]/ []  Follow-up with PCP Additional Notes: Patient reports that he is having cough and body aches. Patient reports he believes he is covid positive, but needs Dr. Perri to prescribe an at home covid test and send it to pharamacy in order for test to be covered by Medicare. Patient reports he is having symptom improvements but needs to have this at home test called in for work purposes. This RN advised home care based on symptoms and advised that she would route request to appropriate person for follow-up. Patient advised to call back with worsening symptoms. Patient verbalized understanding.   Copied from CRM [id number]. Topic: Clinical - Red Word Triage >> Oct 17, 2023 10:18 AM Laurier BROCKS wrote: Red Word that prompted transfer to Nurse Triage: Patient has COVID. He is experiencing cough, stuffy nose, with chills and body aches. Reason for Disposition  [1] COVID-19 diagnosed by positive lab test (e.g., PCR, rapid self-test kit) AND [2] mild symptoms (e.g., cough, fever, others) AND [3] no complications or SOB  Answer Assessment - Initial Assessment Questions 1. COVID-19 DIAGNOSIS: How do you know that you have COVID? (e.g., positive lab test or self-test, diagnosed by doctor or NP/PA, symptoms after exposure).     Saturday self test 2. COVID-19 EXPOSURE: Was there any known exposure to COVID before the symptoms began? CDC Definition of close contact: within 6 feet (2 meters) for a total of 15 minutes or more over a 24-hour period.      none 3. ONSET: When did the COVID-19 symptoms start?      Friday 4. WORST SYMPTOM: What is your worst symptom? (e.g., cough, fever, shortness  of breath, muscle aches)     Cough, body aches 5. COUGH: Do you have a cough? If Yes, ask: How bad is the cough?       yes 6. FEVER: Do you have a fever? If Yes, ask: What is your temperature, how was it measured, and when did it start?     none 7. RESPIRATORY STATUS: Describe your breathing? (e.g., normal; shortness of breath, wheezing, unable to speak)      Normal, stuffy nose 8. BETTER-SAME-WORSE: Are you getting better, staying the same or getting worse compared to yesterday?  If getting worse, ask, In what way?     better 9. OTHER SYMPTOMS: Do you have any other symptoms?  (e.g., chills, fatigue, headache, loss of smell or taste, muscle pain, sore throat)     Cough, body aches 10. HIGH RISK DISEASE: Do you have any chronic medical problems? (e.g., asthma, heart or lung disease, weak immune system, obesity, etc.)       none 11. VACCINE: Have you had the COVID-19 vaccine? If Yes, ask: Which one, how many shots, when did you get it?       unsure 12. PREGNANCY: Is there any chance you are pregnant? When was your last menstrual period?       N/A 13. O2 SATURATION MONITOR:  Do you use an oxygen saturation monitor (pulse oximeter) at home? If Yes, ask What is your reading (oxygen level) today? What is your usual oxygen saturation reading? (e.g., 95%)       unsure  Protocols used: Coronavirus (COVID-19) Diagnosed or Suspected-A-AH

## 2023-10-17 NOTE — Telephone Encounter (Signed)
 Called patient to schedule video visit to talk to doctor about having COVID ad see if he needed any medication and he declined, said he was taking over the counter medication and was fine. What he was wanting was to see if the doctor would call in COVID test to his pharmacy so he could get them for free from his insurance.

## 2023-10-17 NOTE — Telephone Encounter (Signed)
 Called pharmacy and doctors offices do not call in prescriptions for COVID test, he can contact Medicare or [email or web address] and get some according to his pharmacy. Called patient and let him know that, he verbalized understanding.

## 2023-10-20 LAB — COMPLETE METABOLIC PANEL WITH GFR
AG Ratio: 1.7 (calc) (ref 1.0–2.5)
ALT: 32 U/L (ref 9–46)
AST: 33 U/L (ref 10–35)
Albumin: 4.5 g/dL (ref 3.6–5.1)
Alkaline phosphatase (APISO): 56 U/L (ref 35–144)
BUN: 19 mg/dL (ref 7–25)
CO2: 26 mmol/L (ref 20–32)
Calcium: 9.9 mg/dL (ref 8.6–10.3)
Chloride: 103 mmol/L (ref 98–110)
Creat: 1.11 mg/dL (ref 0.70–1.35)
Globulin: 2.6 g/dL (ref 1.9–3.7)
Glucose, Bld: 112 mg/dL — ABNORMAL HIGH (ref 65–99)
Potassium: 4.8 mmol/L (ref 3.5–5.3)
Sodium: 140 mmol/L (ref 135–146)
Total Bilirubin: 1.2 mg/dL (ref 0.2–1.2)
Total Protein: 7.1 g/dL (ref 6.1–8.1)
eGFR: 72 mL/min/{1.73_m2} (ref >=60)

## 2023-10-20 LAB — CBC WITH DIFFERENTIAL/PLATELET
Absolute Lymphocytes: 1439 {cells}/uL (ref 850–3900)
Absolute Monocytes: 405 {cells}/uL (ref 200–950)
Basophils Absolute: 31 {cells}/uL (ref 0–200)
Basophils Relative: 0.7 %
Eosinophils Absolute: 110 {cells}/uL (ref 15–500)
Eosinophils Relative: 2.5 %
HCT: 49.5 % (ref 38.5–50.0)
Hemoglobin: 16.6 g/dL (ref 13.2–17.1)
MCH: 31.9 pg (ref 27.0–33.0)
MCHC: 33.5 g/dL (ref 32.0–36.0)
MCV: 95.2 fL (ref 80.0–100.0)
MPV: 10.3 fL (ref 7.5–12.5)
Monocytes Relative: 9.2 %
Neutro Abs: 2416 {cells}/uL (ref 1500–7800)
Neutrophils Relative %: 54.9 %
Platelets: 257 10*3/uL (ref 140–400)
RBC: 5.2 10*6/uL (ref 4.20–5.80)
RDW: 11.7 % (ref 11.0–15.0)
Total Lymphocyte: 32.7 %
WBC: 4.4 10*3/uL (ref 3.8–10.8)

## 2023-10-20 LAB — LIPID PANEL
Cholesterol: 353 mg/dL — ABNORMAL HIGH (ref <200)
HDL: 47 mg/dL (ref >=40)
LDL Cholesterol (Calc): 251 mg/dL — ABNORMAL HIGH
Non-HDL Cholesterol (Calc): 306 mg/dL — ABNORMAL HIGH (ref <130)
Total CHOL/HDL Ratio: 7.5 (calc) — ABNORMAL HIGH (ref <5.0)
Triglycerides: 325 mg/dL — ABNORMAL HIGH (ref <150)

## 2023-10-20 LAB — HEMOGLOBIN A1C W/OUT EAG: Hgb A1c MFr Bld: 5.6 %{Hb} (ref <5.7)

## 2023-10-20 LAB — PSA: PSA: 2.65 ng/mL (ref <=4.00)

## 2023-10-23 ENCOUNTER — Telehealth: Payer: Self-pay | Admitting: Internal Medicine

## 2023-10-23 ENCOUNTER — Encounter: Payer: Self-pay | Admitting: Internal Medicine

## 2023-10-23 DIAGNOSIS — E782 Mixed hyperlipidemia: Secondary | ICD-10-CM

## 2023-10-23 NOTE — Telephone Encounter (Signed)
Have reviewed labs and contacted patient. He has hx of ASCVD in his brother who had carotid endarterectomy.  Patient has mixed hyperlipidemia. He is not sure if he wants to take statin or not but is willing to do CT coronary scoring. Order placed for [facility]. Pt given phone number to call for appt.

## 2023-10-24 LAB — COLOGUARD: COLOGUARD: NEGATIVE

## 2023-10-26 ENCOUNTER — Ambulatory Visit (HOSPITAL_BASED_OUTPATIENT_CLINIC_OR_DEPARTMENT_OTHER)
Admission: RE | Admit: 2023-10-26 | Discharge: 2023-10-26 | Disposition: A | Discharge status: Home / Self Care | Payer: Self-pay | Source: Ambulatory Visit | Attending: Internal Medicine | Admitting: Internal Medicine

## 2023-10-26 DIAGNOSIS — E782 Mixed hyperlipidemia: Secondary | ICD-10-CM | POA: Insufficient documentation

## 2023-10-29 ENCOUNTER — Encounter: Payer: Self-pay | Admitting: Internal Medicine

## 2023-10-29 NOTE — Patient Instructions (Addendum)
Labs showed mixed hyperlipidemia with elevated triglycerides and cholesterol including LDL cholesterol which is known as "bad "cholesterol.  We are suggesting that you take a statin.  However you have an upcoming coronary calcium score in the near future which should help [place] determine how to proceed.  For issues with prostatism we are sending in Flomax 0.4 mg daily and suggest you see a urologist for evaluation.  Colonoscopy is past due.  Patient declines.  We will send an order for Cologuard.  If you decide to  take a statin medication for hyperlipidemia, you will need follow-up in about 3 months after you have  started on statin medication with fasting labs and office visit to discuss results.

## 2023-11-03 ENCOUNTER — Other Ambulatory Visit: Payer: Self-pay | Admitting: Internal Medicine

## 2023-11-03 DIAGNOSIS — R931 Abnormal findings on diagnostic imaging of heart and coronary circulation: Secondary | ICD-10-CM

## 2024-01-23 NOTE — Progress Notes (Unsigned)
 Cardiology Office Note:  .   Date:  01/25/2024 ID:  [name], DOB 31-May-1956, MRN [id number] PCP: Sylvan Evener, MD [facility] Providers Cardiologist:  None   Patient Profile: .      PMH Aortic atherosclerosis Coronary artery disease CT calcium score 10/26/2023 CAC score 396.5 (70th percentile) LM 116, LAD 254, LCx 20.1, RCA 6.1 Hyperlipidemia Ulcerative colitis  History of nuclear stress test with excellent exercise capacity, low risk test 2010.        History of Present Illness: .    History of Present Illness [name] is a very pleasant 68 y.o. male  who is here today for new patient consult for elevated coronary calcium score. He reports he was diagnosed with hyperlipidemia approximately 10 years ago and he has been resistant to starting statin because he thinks they caused muscle damage and dementia in his father. He is retired, but maintains a very active lifestyle, with regular exercise including weight lifting, cardio workouts, and outdoor activities like hunting. He follows a healthy diet, avoiding gluten and fast food, and focusing on lean meats, fruits, and vegetables. Eats a lot of venison which he hunts. Admits to heavy red wine consumption. History of ulcerative colitis, which is currently under control with the help of a turmeric regimen. He is no longer on medication for UC. No history of hypertension, but had an elevated BP reading recently at the dermatology office. He does not track BP at home. He denies chest pain, shortness of breath, edema, fatigue, palpitations, weakness, presyncope, syncope, orthopnea, and PND.   Family history: His family history includes Heart disease in his father.  Father had dementia and muscle loss that pt feels caused by statins   Discussed the use of AI scribe software for clinical note transcription with the patient, who gave verbal consent to proceed.  ASCVD Risk Score:  RESULT SUMMARY:   High-intensity  statin recommended; if not tolerated or not a candidate, moderate-intensity recommended because of extreme LDL level but no known ASCVD. To view statin dosages by intensity, see Evidence section.  INPUTS: History of ASCVD --> 0 = No LDL Cholesterol >=190mg /dL (6.57 mmol/L) --> 1 = Yes   Diet: Avoids gluten Avoids sugar Minimizes dairy Eats a lot of venison which he hunts, fish, vegetables, and fruits Does occasionally eat chicken wings, french fries  Lots of red wine  Activity: Retired from [employer] in 2016 Previously worked out rigorously starting in 1985 Goes to the gym and has full gym at home - weighted vest walks, recumbent bike, swimming [employer] frequently  Usually starts workout with bike for 24 minutes January, started back working out more regularly Splitting wood  No results found for: "LIPOA"    ROS: See HPI       Studies Reviewed: Aaron Aas   EKG Interpretation Date/Time:  Wednesday January 25 2024 08:51:40 EDT Ventricular Rate:  58 PR Interval:  210 QRS Duration:  88 QT Interval:  426 QTC Calculation: 418 R Axis:   56  Text Interpretation: Sinus bradycardia with 1st degree A-V block No previous ECGs available Confirmed by Slater Duncan [phone]) on 01/25/2024 9:07:44 AM      Risk Assessment/Calculations:     HYPERTENSION CONTROL Vitals:   01/25/24 0858 01/25/24 1221  BP: (!) 150/100 (!) 150/110    [name] blood pressure is elevated above target today.  In order to address the patient's elevated BP: Blood pressure will be monitored at home to determine if medication changes  need to be made.          Physical Exam:   VS: BP (!) 150/110   Pulse (!) 58   Ht 6' (1.829 m)   Wt 195 lb (88.5 kg)   SpO2 97%   BMI 26.45 kg/m   Wt Readings from Last 3 Encounters:  01/25/24 195 lb (88.5 kg)  10/11/23 195 lb (88.5 kg)  10/16/20 187 lb (84.8 kg)     GEN: Well nourished, well developed in no acute distress NECK: No JVD; No carotid  bruits CARDIAC: RRR, no murmurs, rubs, gallops RESPIRATORY:  Clear to auscultation without rales, wheezing or rhonchi  ABDOMEN: Soft, non-tender, non-distended EXTREMITIES:  No edema; No deformity     ASSESSMENT AND PLAN: .    Assessment & Plan Coronary artery disease CT calcium score of 396.5 (70th percentile) with bulk of calcification in the left main and LAD arteries. He is very active and denies chest pain, dyspnea, or other symptoms concerning for angina.  Risk factors include untreated significantly elevated cholesterol, history of heart disease in his father, and BP is elevated today.  No ST abnormality on EKG today. We reviewed risk factors and through shared decision making, we will proceed with getting exercise nuclear stress test for evaluation of ischemia. He is hesitant about statins due to family history but is open to non-statin options as noted below. ischemia. Provided information on statins and non-statin cholesterol-lowering medications, including benpedoic acid and PCSK9 inhibitors. We will check lipoprotein a today. Encouraged continued focus on secondary prevention including heart healthy mostly plant based diet avoiding saturated fat, processed foods, simple carbohydrates, and sugar along with aiming for at least 150 minutes of moderate intensity exercise each week.   Hyperlipidemia LDL goal < 70/History of LDL > 190 mg/dL He has long-standing hyperlipidemia with LDL at 250 mg/dL and triglycerides at 811 mg/dL. He is hesitant about statins due to a family history of adverse effects but is open to non-statin medications. Dietary modifications to reduce triglycerides were discussed. Information on statins and non-statin cholesterol-lowering medications provided. Discuss the potential use of low dose rosuvastatin, Nexletol, and PCSK9i. We will check LP(a) today. He will review materials provided and notify us  if he elects to start lipid lowering therapy.   Sinus bradycardia with  First Degree AV block EKG today reveals sinus bradycardia at 58 bpm with 1st degree AV block. No previous EKG for comparison. He is asymptomatic. We will continue to monitor with serial EKG.   Elevated BP without diagnosis of hypertension   BP elevated in clinic today and remains elevated on my recheck. Advised goal BP 120/80 mmHg, with tolerance up to 130/80 mmHg. Encouraged him to monitor home BP for the next 2 weeks and report to me or PCP.     Informed Consent   Shared Decision Making/Informed Consent The risks [chest pain, shortness of breath, cardiac arrhythmias, dizziness, blood pressure fluctuations, myocardial infarction, stroke/transient ischemic attack, nausea, vomiting, allergic reaction, radiation exposure, metallic taste sensation and life-threatening complications (estimated to be 1 in 10,000)], benefits (risk stratification, diagnosing coronary artery disease, treatment guidance) and alternatives of a nuclear stress test were discussed in detail with Mr. Voiles and he agrees to proceed.     Disposition: 3 months with me  Signed, Slater Duncan, NP-C

## 2024-01-25 ENCOUNTER — Encounter (HOSPITAL_BASED_OUTPATIENT_CLINIC_OR_DEPARTMENT_OTHER): Payer: Self-pay | Admitting: Nurse Practitioner

## 2024-01-25 ENCOUNTER — Ambulatory Visit (HOSPITAL_BASED_OUTPATIENT_CLINIC_OR_DEPARTMENT_OTHER): Payer: Self-pay | Admitting: Nurse Practitioner

## 2024-01-25 VITALS — BP 150/110 | HR 58 | Ht 72.0 in | Wt 195.0 lb

## 2024-01-25 DIAGNOSIS — R030 Elevated blood-pressure reading, without diagnosis of hypertension: Secondary | ICD-10-CM

## 2024-01-25 DIAGNOSIS — I708 Atherosclerosis of other arteries: Secondary | ICD-10-CM | POA: Diagnosis not present

## 2024-01-25 DIAGNOSIS — R931 Abnormal findings on diagnostic imaging of heart and coronary circulation: Secondary | ICD-10-CM

## 2024-01-25 DIAGNOSIS — E7800 Pure hypercholesterolemia, unspecified: Secondary | ICD-10-CM

## 2024-01-25 DIAGNOSIS — E782 Mixed hyperlipidemia: Secondary | ICD-10-CM

## 2024-01-25 NOTE — Patient Instructions (Addendum)
 Medication Instructions:  START ASPIRIN 81 MG DAILY   *If you need a refill on your cardiac medications before your next appointment, please call your pharmacy*  Lab Work: [facility] TODAY   If you have labs (blood work) drawn today and your tests are completely normal, you will receive your results only by: MyChart Message (if you have MyChart) OR A paper copy in the mail If you have any lab test that is abnormal or we need to change your treatment, we will call you to review the results.  Testing/Procedures: Your physician has requested that you have en exercise stress myoview. For further information please visit [email or web address] Please follow instruction sheet, as given.  Follow-Up: At [facility], you and your health needs are our priority.  As part of our continuing mission to provide you with exceptional heart care, our providers are all part of one team.  This team includes your primary Cardiologist (physician) and Advanced Practice Providers or APPs (Physician Assistants and Nurse Practitioners) who all work together to provide you with the care you need, when you need it.  Your next appointment:   2 month(s) in lipid clinic   Provider:   Slater Duncan, NP    We recommend signing up for the patient portal called "MyChart".  Sign up information is provided on this After Visit Summary.  MyChart is used to connect with patients for Virtual Visits (Telemedicine).  Patients are able to view lab/test results, encounter notes, upcoming appointments, etc.  Non-urgent messages can be sent to your provider as well.   To learn more about what you can do with MyChart, go to [email or web address].   Other Instructions MONITOR AND LOG YOUR BLOOD PRESSURE DAILY FOR A GOAL OF LESS THAN 120/80  Evolocumab Injection What is this medication? EVOLOCUMAB (e voe LOK ue mab) treats high cholesterol. It may also be used to lower the risk of heart attack, stroke, and a type of heart  surgery. It works by decreasing bad cholesterol (such as LDL) in your blood. It is a monoclonal antibody. Changes to diet and exercise are often combined with this medication. This medicine may be used for other purposes; ask your health care provider or pharmacist if you have questions. COMMON BRAND NAME(S): Repatha, Repatha SureClick What should I tell my care team before I take this medication? They need to know if you have any of these conditions: An unusual or allergic reaction to evolocumab, latex, other medications, foods, dyes, or preservatives Pregnant or trying to get pregnant Breast-feeding How should I use this medication? This medication is injected under the skin. You will be taught how to prepare and give it. Take it as directed on the prescription label at the same time every day. Keep taking it unless your care team tells you to stop. It is important that you put your used needles and syringes in a special sharps container. Do not put them in a trash can. If you do not have a sharps container, call your pharmacist or care team to get one. This medication comes with INSTRUCTIONS FOR USE. Ask your pharmacist for directions on how to use this medication. Read the information carefully. Talk to your pharmacist or care team if you have questions. Talk to your care team about the use of this medication in children. While it may be prescribed for children as young as 10 years for selected conditions, precautions do apply. Overdosage: If you think you have taken too much of this medicine contact a  poison control center or emergency room at once. NOTE: This medicine is only for you. Do not share this medicine with others. What if I miss a dose? It is important not to miss any doses. Talk to your care team about what to do if you miss a dose. What may interact with this medication? Interactions are not expected. This list may not describe all possible interactions. Give your health care  provider a list of all the medicines, herbs, non-prescription drugs, or dietary supplements you use. Also tell them if you smoke, drink alcohol, or use illegal drugs. Some items may interact with your medicine. What should I watch for while using this medication? Visit your care team for regular checks on your progress. Tell your care team if your symptoms do not start to get better or if they get worse. You may need blood work while you are taking this medication. Do not wear the on-body infuser during an MRI. Taking this medication is only part of a total heart healthy program. Ask your care team if there are other changes you can make to improve your overall health. What side effects may I notice from receiving this medication? Side effects that you should report to your care team as soon as possible: Allergic reactions or angioedema--skin rash, itching or hives, swelling of the face, eyes, lips, tongue, arms, or legs, trouble swallowing or breathing Side effects that usually do not require medical attention (report to your care team if they continue or are bothersome): Back pain Flu-like symptoms--fever, chills, muscle pain, cough, headache, fatigue Pain, redness, or irritation at injection site Runny or stuffy nose Sore throat This list may not describe all possible side effects. Call your doctor for medical advice about side effects. You may report side effects to FDA at 1-800-FDA-1088. Where should I keep my medication? Keep out of the reach of children and pets. Store in a refrigerator or at room temperature between 20 and 25 degrees C (68 and 77 degrees F). Refrigeration (preferred): Store it in the refrigerator. Do not freeze. Keep it in the original carton until you are ready to take it. Remove the dose from the carton about 30 minutes before it is time for you to take it. Get rid of any unused medication after the expiration date. Room temperature: This medication may be stored at room  temperature for up to 30 days. Keep it in the original carton until you are ready to take it. If it is stored at room temperature, get rid of any unused medication after 30 days or after it expires, whichever is first. Protect from light. Do not shake. Avoid exposure to extreme heat. To get rid of medications that are no longer needed or have expired: Take the medication to a medication take-back program. Check with your pharmacy or law enforcement to find a location. If you cannot return the medication, ask your pharmacist or care team how to get rid of this medication safely. NOTE: This sheet is a summary. It may not cover all possible information. If you have questions about this medicine, talk to your doctor, pharmacist, or health care provider.  2024 Elsevier/Gold Standard (2021-10-30 00:00:00)

## 2024-01-26 ENCOUNTER — Encounter (HOSPITAL_BASED_OUTPATIENT_CLINIC_OR_DEPARTMENT_OTHER): Payer: Self-pay

## 2024-01-26 LAB — LIPOPROTEIN A (LPA): Lipoprotein (a): 113.9 nmol/L — ABNORMAL HIGH (ref <75.0)

## 2024-01-30 ENCOUNTER — Encounter (HOSPITAL_COMMUNITY): Payer: Self-pay

## 2024-02-03 ENCOUNTER — Other Ambulatory Visit (HOSPITAL_BASED_OUTPATIENT_CLINIC_OR_DEPARTMENT_OTHER): Payer: Self-pay | Admitting: Nurse Practitioner

## 2024-02-03 DIAGNOSIS — I708 Atherosclerosis of other arteries: Secondary | ICD-10-CM

## 2024-02-03 DIAGNOSIS — R931 Abnormal findings on diagnostic imaging of heart and coronary circulation: Secondary | ICD-10-CM

## 2024-02-03 DIAGNOSIS — E782 Mixed hyperlipidemia: Secondary | ICD-10-CM

## 2024-02-08 ENCOUNTER — Ambulatory Visit (HOSPITAL_COMMUNITY): Attending: Cardiology

## 2024-02-08 DIAGNOSIS — I708 Atherosclerosis of other arteries: Secondary | ICD-10-CM | POA: Diagnosis present

## 2024-02-08 DIAGNOSIS — R931 Abnormal findings on diagnostic imaging of heart and coronary circulation: Secondary | ICD-10-CM

## 2024-02-08 DIAGNOSIS — E782 Mixed hyperlipidemia: Secondary | ICD-10-CM

## 2024-02-08 MED ORDER — TECHNETIUM TC 99M TETROFOSMIN IV KIT
32.1000 | PACK | Freq: Once | INTRAVENOUS | Status: AC | PRN
  Administered 2024-02-08: 32.1 via INTRAVENOUS

## 2024-02-08 MED ORDER — TECHNETIUM TC 99M TETROFOSMIN IV KIT
10.7000 | PACK | Freq: Once | INTRAVENOUS | Status: AC | PRN
  Administered 2024-02-08: 10.7 via INTRAVENOUS

## 2024-02-09 ENCOUNTER — Encounter (HOSPITAL_BASED_OUTPATIENT_CLINIC_OR_DEPARTMENT_OTHER): Payer: Self-pay

## 2024-02-14 NOTE — Telephone Encounter (Signed)
 FYI

## 2024-02-15 ENCOUNTER — Ambulatory Visit (HOSPITAL_BASED_OUTPATIENT_CLINIC_OR_DEPARTMENT_OTHER): Payer: Self-pay | Admitting: Nurse Practitioner

## 2024-02-17 LAB — MYOCARDIAL PERFUSION IMAGING
Angina Index: 0
Duke Treadmill Score: 12
Estimated workload: 13.4
Exercise duration (min): 12 min
Exercise duration (sec): 0 s
LV dias vol: 111 mL (ref 62–150)
LV sys vol: 44 mL
MPHR: 151 {beats}/min
Nuc Stress EF: 60 %
Peak HR: 146 {beats}/min
Percent HR: 96 %
Rest HR: 69 {beats}/min
Rest Nuclear Isotope Dose: 10.7 mCi
SDS: 1
SRS: 0
SSS: 1
ST Depression (mm): 0 mm
Stress Nuclear Isotope Dose: 32.1 mCi
TID: 0.86

## 2024-03-28 ENCOUNTER — Encounter (HOSPITAL_BASED_OUTPATIENT_CLINIC_OR_DEPARTMENT_OTHER): Payer: Self-pay | Admitting: Nurse Practitioner

## 2024-03-28 ENCOUNTER — Ambulatory Visit (INDEPENDENT_AMBULATORY_CARE_PROVIDER_SITE_OTHER): Admitting: Nurse Practitioner

## 2024-03-28 VITALS — BP 120/86 | HR 56 | Ht 72.0 in | Wt 191.4 lb

## 2024-03-28 DIAGNOSIS — E7800 Pure hypercholesterolemia, unspecified: Secondary | ICD-10-CM | POA: Diagnosis not present

## 2024-03-28 DIAGNOSIS — R001 Bradycardia, unspecified: Secondary | ICD-10-CM

## 2024-03-28 DIAGNOSIS — R030 Elevated blood-pressure reading, without diagnosis of hypertension: Secondary | ICD-10-CM

## 2024-03-28 DIAGNOSIS — E7841 Elevated Lipoprotein(a): Secondary | ICD-10-CM

## 2024-03-28 DIAGNOSIS — R931 Abnormal findings on diagnostic imaging of heart and coronary circulation: Secondary | ICD-10-CM | POA: Diagnosis not present

## 2024-03-28 NOTE — Patient Instructions (Signed)
 Medication Instructions:   Your physician recommends that you continue on your current medications as directed. Please refer to the Current Medication list given to you today.   *If you need a refill on your cardiac medications before your next appointment, please call your pharmacy*  Lab Work:  Your physician recommends that you return for a FASTING NMR, fasting after midnight. 1 week prior to appointment with Rosaline. Paperwork given to patient today.    If you have labs (blood work) drawn today and your tests are completely normal, you will receive your results only by: MyChart Message (if you have MyChart) OR A paper copy in the mail If you have any lab test that is abnormal or we need to change your treatment, we will call you to review the results.  Testing/Procedures:  None ordered.   Follow-Up: At [facility], you and your health needs are our priority.  As part of our continuing mission to provide you with exceptional heart care, our providers are all part of one team.  This team includes your primary Cardiologist (physician) and Advanced Practice Providers or APPs (Physician Assistants and Nurse Practitioners) who all work together to provide you with the care you need, when you need it.  Your next appointment:   3 month(s)  Provider:   Rosaline Bane, NP    We recommend signing up for the patient portal called MyChart.  Sign up information is provided on this After Visit Summary.  MyChart is used to connect with patients for Virtual Visits (Telemedicine).  Patients are able to view lab/test results, encounter notes, upcoming appointments, etc.  Non-urgent messages can be sent to your provider as well.   To learn more about what you can do with MyChart, go to [email or web address].

## 2024-03-28 NOTE — Progress Notes (Signed)
 Cardiology Office Note:  .   Date:  03/28/2024 ID:  [name], DOB 1956/06/19, MRN [id number] PCP: Perri Ronal PARAS, MD [facility] Providers Cardiologist:  None   Patient Profile: .      PMH Aortic atherosclerosis Coronary artery disease CT calcium score 10/26/2023 CAC score 396.5 (70th percentile) LM 116, LAD 254, LCx 20.1, RCA 6.1 Hyperlipidemia Ulcerative colitis Elevated Lipoprotein a  History of nuclear stress test with excellent exercise capacity, low risk test 2010.     Seen by  me on 01/25/24 as a new patient consult for elevated coronary calcium score. He reports he was diagnosed with hyperlipidemia approximately 10 years ago and he has been resistant to starting statin because he thinks they caused muscle damage and dementia in his father. He is retired, but maintains a very active lifestyle, with regular exercise including weight lifting, cardio workouts, and outdoor activities like hunting. He follows a healthy diet, avoiding gluten and fast food, and focusing on lean meats, fruits, and vegetables. Eats a lot of venison which he hunts. Admits to heavy red wine consumption. History of ulcerative colitis, which is currently under control with the help of a turmeric regimen. He is no longer on medication for UC. No history of hypertension, but had an elevated BP reading at office visit and also at recent dermatology visit. Does not track BP at home. He denies chest pain, shortness of breath, edema, fatigue, palpitations, weakness, presyncope, syncope, orthopnea, and PND    History of Present Illness: .    History of Present Illness  [name] is a very pleasant 69 year old male who is here today for follow-up of CAD. He has been monitoring BP regularly, observing a downward trend in both systolic and diastolic readings recently. Diastolic pressure is often in the 70s to 80s, with systolic pressure ranging from 130 to 140. Blood pressure is recorded after  activities like golf, treadmill workouts, and yard work. He remains physically active, walking 18 holes of golf, using a treadmill at 4 mph for 24 minutes on a 3% incline, lifting weights, and swimming a quarter mile. He experiences no shortness of breath, chest pain, palpitations, lightheadedness, presyncope, syncope or other symptoms concerning for angina during these activities. He occasionally naps but does not have significant fatigue after exertion. He takes 81 mg aspirin, fish oil, and niacin for cholesterol management, having recently restarted fish oil and niacin. Lipid panel from January showed triglycerides at 325 mg/dL and LDL cholesterol at 251 mg/dL, with a previous LDL reading of 262 mg/dL. Lipoprotein(a) level was 113 mg/dL. He has a familial history of high cholesterol. He generally eats a healthy diet and avoids fast food and high fat dairy. Uses honey as a sweetener and rarely consumes sugar or sweets. Has been working on reducing consumption of red wine.    Diet: Avoids gluten, sugar Minimizes dairy Eats a lot of venison which he hunts, fish, vegetables, and fruits Does occasionally eat chicken wings, french fries  Lots of red wine   Lipoprotein (a)  Date/Time Value Ref Range Status  01/25/2024 10:38 AM 113.9 (H) <75.0 nmol/L Final    Comment:    Note:  Values greater than or equal to 75.0 nmol/L may        indicate an independent risk factor for CHD,        but must be evaluated with caution when applied        to non-Caucasian populations due to the  influence of genetic factors on Lp(a) across        ethnicities.       ROS: See HPI       Studies Reviewed: .          Risk Assessment/Calculations:             Physical Exam:   VS: BP 120/86   Pulse (!) 56   Ht 6' (1.829 m)   Wt 191 lb 6.4 oz (86.8 kg)   SpO2 97%   BMI 25.96 kg/m   Wt Readings from Last 3 Encounters:  03/28/24 191 lb 6.4 oz (86.8 kg)  02/08/24 195 lb (88.5 kg)  01/25/24 195 lb  (88.5 kg)     GEN: Well nourished, well developed in no acute distress NECK: No JVD; No carotid bruits CARDIAC: RRR, no murmurs, rubs, gallops RESPIRATORY:  Clear to auscultation without rales, wheezing or rhonchi  ABDOMEN: Soft, non-tender, non-distended EXTREMITIES:  No edema; No deformity     ASSESSMENT AND PLAN: .    Assessment & Plan Coronary artery calcification  CT calcium score 11/02/23 with CAC score of 396.5 (70th percentile) with bulk of calcification in the left main and LAD arteries. Due to risk factors of family history and hyperlipidemia, exercise myoview  was completed 02/08/24 and was low risk with no evidence of ischemia or infarction. He remains very active and denies chest pain, dyspnea, or other symptoms concerning for angina.  Lengthy discussion about goal LDL 70 or lower.  He continues to work on lifestyle modification including reducing intake of venison which is high cholesterol, increasing physical activity, and reducing intake of red wine and would like to repeat lipid testing in 3 months. He will consider options for lipid management and we will plan to discuss at next office visit after lipid testing.  Elevated LP(a) Mildly elevated LP(a) at 113.9. Consider PCSK9 inhibitors for lipoprotein(a) reduction. Information for his review provided.   Hyperlipidemia LDL goal < 70/History of LDL > 190 mg/dL He has long-standing hyperlipidemia with LDL at 250 mg/dL and triglycerides at 674 mg/dL. He is hesitant about statins due to a family history of adverse effects but is open to non-statin medications. Dietary modifications to reduce triglycerides were discussed. Information on statins and non-statin cholesterol-lowering medications provided. He will review materials provided and notify us  if he elects to start lipid lowering therapy.   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 He is asymptomatic. We will continue to  monitor clinically at this time.   Elevated BP without diagnosis of hypertension   BP was elevated at initial clinic visit on 01/25/24. He has been tracking at home with most readings 130-140/70-80 mmHg. Advised goal BP 120/80 mmHg, with tolerance up to 130/80 mmHg. Encouraged him to monitor home BP and report consistent elevations to us  or PCP.         Disposition: 3-4 months with me  Signed, Rosaline Bane, NP-C

## 2024-06-21 ENCOUNTER — Ambulatory Visit: Payer: Self-pay | Admitting: Nurse Practitioner

## 2024-06-21 LAB — NMR, LIPOPROFILE
Cholesterol, Total: 313 mg/dL — ABNORMAL HIGH (ref 100–199)
HDL Particle Number: 29.3 umol/L — ABNORMAL LOW (ref >=30.5)
HDL-C: 42 mg/dL (ref >39)
LDL Particle Number: 2792 nmol/L — ABNORMAL HIGH (ref <1000)
LDL Size: 20.4 nm — ABNORMAL LOW (ref >20.5)
LDL-C (NIH Calc): 232 mg/dL — ABNORMAL HIGH (ref 0–99)
LP-IR Score: 64 — ABNORMAL HIGH (ref <=45)
Small LDL Particle Number: 1858 nmol/L — ABNORMAL HIGH (ref <=527)
Triglycerides: 197 mg/dL — ABNORMAL HIGH (ref 0–149)

## 2024-06-28 ENCOUNTER — Encounter (HOSPITAL_BASED_OUTPATIENT_CLINIC_OR_DEPARTMENT_OTHER): Payer: Self-pay | Admitting: Nurse Practitioner

## 2024-06-28 ENCOUNTER — Ambulatory Visit (INDEPENDENT_AMBULATORY_CARE_PROVIDER_SITE_OTHER): Admitting: Nurse Practitioner

## 2024-06-28 VITALS — BP 154/98 | HR 69 | Resp 17 | Ht 72.0 in | Wt 195.0 lb

## 2024-06-28 DIAGNOSIS — E7841 Elevated Lipoprotein(a): Secondary | ICD-10-CM | POA: Diagnosis not present

## 2024-06-28 DIAGNOSIS — R001 Bradycardia, unspecified: Secondary | ICD-10-CM

## 2024-06-28 DIAGNOSIS — E7800 Pure hypercholesterolemia, unspecified: Secondary | ICD-10-CM

## 2024-06-28 DIAGNOSIS — R030 Elevated blood-pressure reading, without diagnosis of hypertension: Secondary | ICD-10-CM | POA: Diagnosis not present

## 2024-06-28 DIAGNOSIS — R931 Abnormal findings on diagnostic imaging of heart and coronary circulation: Secondary | ICD-10-CM

## 2024-06-28 DIAGNOSIS — E785 Hyperlipidemia, unspecified: Secondary | ICD-10-CM

## 2024-06-28 DIAGNOSIS — Z7189 Other specified counseling: Secondary | ICD-10-CM

## 2024-06-28 NOTE — Patient Instructions (Addendum)
 Medication Instructions:   PLEASE consider hydrochlorothiazide one (1) tablet by mouth ( 25 mg) daily.  If you start hydrochlorothiazide you will need to get labs in two weeks to check your kidney function.   *If you need a refill on your cardiac medications before your next appointment, please call your pharmacy*  Lab Work:  None ordered.  If you have labs (blood work) drawn today and your tests are completely normal, you will receive your results only by: MyChart Message (if you have MyChart) OR A paper copy in the mail If you have any lab test that is abnormal or we need to change your treatment, we will call you to review the results.  Testing/Procedures:  None ordered.  Follow-Up: At [facility], you and your health needs are our priority.  As part of our continuing mission to provide you with exceptional heart care, our providers are all part of one team.  This team includes your primary Cardiologist (physician) and Advanced Practice Providers or APPs (Physician Assistants and Nurse Practitioners) who all work together to provide you with the care you need, when you need it.  Your next appointment:   6 month(s)  Provider:   Rosaline Bane, NP    We recommend signing up for the patient portal called MyChart.  Sign up information is provided on this After Visit Summary.  MyChart is used to connect with patients for Virtual Visits (Telemedicine).  Patients are able to view lab/test results, encounter notes, upcoming appointments, etc.  Non-urgent messages can be sent to your provider as well.   To learn more about what you can do with MyChart, go to [email or web address].   Other Instructions  Your physician wants you to follow-up in: 6 months.  You will receive a reminder letter in the mail two months in advance. If you don't receive a letter, please call our office to schedule the follow-up appointment.  Evolocumab Injection What is this  medication? EVOLOCUMAB (e voe LOK ue mab) treats high cholesterol. It may also be used to lower the risk of heart attack, stroke, worsening chest pain (unstable angina), and a type of heart surgery. It works by decreasing bad cholesterol (such as LDL) in your blood. It is a monoclonal antibody. Changes to diet and exercise are often combined with this medication. This medicine may be used for other purposes; ask your health care provider or pharmacist if you have questions. COMMON BRAND NAME(S): Repatha, Repatha SureClick What should I tell my care team before I take this medication? They need to know if you have any of these conditions: An unusual or allergic reaction to evolocumab, latex, other medications, foods, dyes, or preservatives Pregnant or trying to get pregnant Breast-feeding How should I use this medication? This medication is injected under the skin. You will be taught how to prepare and give it. Take it as directed on the prescription label at the same time every day. Keep taking it unless your care team tells you to stop. It is important that you put your used needles and syringes in a special sharps container. Do not put them in a trash can. If you do not have a sharps container, call your pharmacist or care team to get one. This medication comes with INSTRUCTIONS FOR USE. Ask your pharmacist for directions on how to use this medication. Read the information carefully. Talk to your pharmacist or care team if you have questions. Talk to your care team about the use of this medication in  children. While it may be prescribed for children as young as 10 years for selected conditions, precautions do apply. Overdosage: If you think you have taken too much of this medicine contact a poison control center or emergency room at once. NOTE: This medicine is only for you. Do not share this medicine with others. What if I miss a dose? It is important not to miss any doses. Talk to your care team  about what to do if you miss a dose. What may interact with this medication? Interactions are not expected. This list may not describe all possible interactions. Give your health care provider a list of all the medicines, herbs, non-prescription drugs, or dietary supplements you use. Also tell them if you smoke, drink alcohol, or use illegal drugs. Some items may interact with your medicine. What should I watch for while using this medication? Visit your care team for regular checks on your progress. Tell your care team if your symptoms do not start to get better or if they get worse. You may need blood work while you are taking this medication. Do not wear the on-body infuser during an MRI. Taking this medication is only part of a total heart healthy program. Ask your care team if there are other changes you can make to improve your overall health. What side effects may I notice from receiving this medication? Side effects that you should report to your care team as soon as possible: Allergic reactions or angioedema--skin rash, itching or hives, swelling of the face, eyes, lips, tongue, arms, or legs, trouble swallowing or breathing Side effects that usually do not require medical attention (report to your care team if they continue or are bothersome): Back pain Flu-like symptoms--fever, chills, muscle pain, cough, headache, fatigue Pain, redness, or irritation at injection site Runny or stuffy nose Sore throat This list may not describe all possible side effects. Call your doctor for medical advice about side effects. You may report side effects to FDA at 1-800-FDA-1088. Where should I keep my medication? Keep out of the reach of children and pets. Store in a refrigerator or at room temperature between 20 and 25 degrees C (68 and 77 degrees F). Refrigeration (preferred): Store it in the refrigerator. Do not freeze. Keep it in the original carton until you are ready to take it. Remove the dose  from the carton about 30 minutes before it is time for you to take it. Get rid of any unused medication after the expiration date. Room temperature: This medication may be stored at room temperature for up to 30 days. Keep it in the original carton until you are ready to take it. If it is stored at room temperature, get rid of any unused medication after 30 days or after it expires, whichever is first. Protect from light. Do not shake. Avoid exposure to extreme heat. To get rid of medications that are no longer needed or have expired: Take the medication to a medication take-back program. Check with your pharmacy or law enforcement to find a location. If you cannot return the medication, ask your pharmacist or care team how to get rid of this medication safely. NOTE: This sheet is a summary. It may not cover all possible information. If you have questions about this medicine, talk to your doctor, pharmacist, or health care provider.  2025 Elsevier/Gold Standard (2023-11-07 00:00:00)

## 2024-06-28 NOTE — Progress Notes (Signed)
 Cardiology Office Note:  .   Date:  06/28/2024 ID:  [name], DOB [birth year], MRN [id number] PCP: Perri Ronal PARAS, MD [name] Cardiologist:  None   Patient Profile: .      PMH Aortic atherosclerosis Coronary artery disease CT calcium score 10/26/2023 CAC score 396.5 (70th percentile) LM 116, LAD 254, LCx 20.1, RCA 6.1 Hyperlipidemia Ulcerative colitis Elevated Lipoprotein a  History of nuclear stress test with excellent exercise capacity, low risk test 2010.     Seen by  me on 01/25/24 as a new patient consult for elevated coronary calcium score. Diagnosed with hyperlipidemia approximately 10 years prior,  resistant to starting statin because he thinks they caused muscle damage and dementia in his father. He is retired, but maintains a very active lifestyle, with regular exercise including weight lifting, cardio workouts, and outdoor activities like hunting. Diet is healthy, avoiding gluten and fast food, and focusing on lean meats, fruits, and vegetables. Eats a lot of venison which he hunts. Admits to heavy red wine consumption. History of ulcerative colitis, which is currently under control with the help of a turmeric regimen, no longer on medication for UC. No history of hypertension, but had an elevated BP reading at office visit and also at recent dermatology visit. No concerning cardiac symptoms.  Seen in clinic on 03/28/24 by me for follow-up of CAD. Monitoring BP regularly, observing a downward trend in both systolic and diastolic readings recently, 130-140/70-80. He remains physically active, walking 18 holes of golf, using a treadmill at 4 mph for 24 minutes on a 3% incline, lifting weights, and swimming a quarter mile. No shortness of breath, chest pain, palpitations, lightheadedness, presyncope, syncope or other symptoms concerning for angina during these activities. Occasionally naps but does not have significant fatigue after exertion. Taking 81 mg  aspirin, fish oil, and niacin for cholesterol management, having recently restarted fish oil and niacin. Lipid panel from January showed triglycerides at 325 mg/dL and LDL cholesterol at 251 mg/dL, with a previous LDL reading of 262 mg/dL. Lipoprotein(a) level was 113 mg/dL, concerning for familial hyperlipidemia.  Has been working on reducing consumption of red wine.     History of Present Illness: .    History of Present Illness  [name] is a very pleasant 68 year old male who is here today for follow-up of CAD. He is feeling well and remains very active with regular intensive exercise. Recent stress test showed no ischemia or infarction. We discussed that this test does not reveal plaque analysis. He denies chest pain, dyspnea, orthopnea, PND, edema, palpitations, presyncope or syncope with intense exercise.  Routinely walks fast at an elevated pace, then lifts weights and then swims. He is concerned about elevated lipoprotein A levels, previously measured at 113.9, with a target of less than 75. He is interested in potential treatments, including PCSK9 inhibitors, and has been taking over-the-counter supplements based on a book he read. Triglycerides have improved from 325, however, LDL particle number remains high at 2792, with small particles at 1858. Family history is significant for his father having had a quadruple bypass but he was a heavy smoker. He monitors blood pressure three times daily, with recent readings averaging 139/79, though a higher reading of 155/98 was noted during the visit. He takes a baby aspirin daily. No bleeding concerns.  Advised he can follow-up with MD in the future if he prefers, no offense to me.    Lipoprotein (a)  Date/Time Value Ref Range Status  01/25/2024 10:38 AM 113.9 (H) <75.0 nmol/L Final    Comment:    Note:  Values greater than or equal to 75.0 nmol/L may        indicate an independent risk factor for CHD,        but must be evaluated with  caution when applied        to non-Caucasian populations due to the        influence of genetic factors on Lp(a) across        ethnicities.       ROS: See HPI       Studies Reviewed: .          Risk Assessment/Calculations:     HYPERTENSION CONTROL Vitals:   06/28/24 0803 06/28/24 1014  BP: (!) 150/80 (!) 154/98    [name] blood pressure is elevated above target today.  In order to address the patient's elevated BP: -- (Medication recommended)          Physical Exam:   VS: BP (!) 154/98   Pulse 69   Resp 17   Ht 6' (1.829 m)   Wt 195 lb (88.5 kg)   SpO2 97%   BMI 26.45 kg/m   Wt Readings from Last 3 Encounters:  06/28/24 195 lb (88.5 kg)  03/28/24 191 lb 6.4 oz (86.8 kg)  02/08/24 195 lb (88.5 kg)     GEN: Well nourished, well developed in no acute distress NECK: No JVD; No carotid bruits CARDIAC: RRR, no murmurs, rubs, gallops RESPIRATORY:  Clear to auscultation without rales, wheezing or rhonchi  ABDOMEN: Soft, non-tender, non-distended EXTREMITIES:  No edema; No deformity     ASSESSMENT AND PLAN: .    Assessment & Plan Coronary artery calcification  Cardiac risk CT calcium score 11/02/23 with CAC score of 396.5 (70th percentile) with bulk of calcification in the left main and LAD arteries. Due to risk factors of family history and hyperlipidemia, exercise myoview  was completed 02/08/24 and was low risk with no evidence of ischemia or infarction. Reviewed/reminded him that we do not have information on soft plaque burden and risk of future ASCVD that remain not well controlled. He remains very active and denies chest pain, dyspnea, or other symptoms concerning for angina. He is considering starting PCSK9 inhibitor for elevated LP(a) and significantly elevated lipids.  He is taking multiple supplements for management of cholesterol based on a book he read.  Plans to have lipid testing in January with PCP. - Consider lipid-lowering therapy for LDL goal 55  or lower - Continue aspirin, fish oil  Elevated LP(a) Hyperlipidemia LDL goal < 55 History of LDL > 190 mg/dL Mildly elevated LP(a) at 113.9 with goal of < 75.  Most recent lipid panel with LDL particle #2792, LDL-C 767, HDL-C 42, triglycerides 197, total cholesterol 313, small LDL-P 1858. Long-standing hyperlipidemia with LDL at 250 mg/dL and triglycerides at 674 mg/dL at time of initial referral 10/2023. He is hesitant about statins due to a family history of adverse effects but is open to non-statin medications. Discussed particular concern with high small LDL-P number and elevated LP(a).  He has been instituting dietary modifications and taking supplements to reduce cholesterol. Non-statin lipid therapy information provided. Is planning to have repeat lipid testing in January 2026 with PCP. Will notify us  if he decides to start lipid lowering therapy. 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 He is asymptomatic.  -Continue to monitor clinically at this time.   Elevated BP without diagnosis of hypertension   BP is elevated initially and remains elevated on my recheck. Home BP average is 144 systolic.  Advised goal BP 120-130/80 mmHg. He has been hesitant to start anti-hypertensive therapy.  We discussed how this contributes to cardiovascular risk.   -Recommend starting hydrochlorothiazide 25 mg daily with BMET 2 weeks after initiation - Continue to monitor home BP        Disposition: 6 months with me; can refer to MD if he prefers  Signed, Rosaline Bane, NP-C

## 2024-10-01 NOTE — Progress Notes (Signed)
 "  Annual Wellness Visit   Patient Care Team: Nissa Stannard, Ronal PARAS, MD as PCP - General (Internal Medicine) Percy, Rosaline HERO, NP as Nurse Practitioner (Cardiology)  Visit Date: 10/12/2024   Chief Complaint  Patient presents with   Annual Exam   Subjective:  Patient: [name], Male DOB: 22-Sep-1956, 68 y.o. MRN: [id number] Vitals:   10/12/24 0931  BP: 120/80   [name] is a 68 y.o. Male who presents today for his Annual Wellness Visit. Patient has Hyperlipidemia; Hemorrhoids; Ventral hernia; Ulcerative colitis (HCC); Alopecia areata; and Seborrhea capitis on their problem list.   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   He complained of itchiness and dryness around his anus. Physical exam revealed Perianal Erythema.     He has a lipoma on left side of his back about 1.5 inches in length. He is interested in getting it removed.   History of Ulcerative Colitis diagnosed by Dr. Princella Nida in 2007. Began as rectal bleeding in August 2007. Initially was treated with Imuran and Asacol , but he has since stopped taking these medications.    History of Hyperlipidemia - refuses to be on statin medication. In 2007 he had a NEGATIVE Cardiolite study. Had carotid Dopplers in 2016 showing a 1 - 39% bilateral ICA stenosis - has not wanted to have repeat study.  10/11/2024 Lipid panel CHOL 313, Triglycerides 198, LDL 228, Otherwise WNL.       Colonoscopy 07/19/06 with these findings: 1. colon, 20 cm, biopsies: fragments of colonic mucosa with superficial hemorrhages in lamina propria. no active mucosal inflammation, granulomas or dysplasia identified. 2. rectum/colon, 5 cm, biopsy: active chronic mucosal proctitis consistent with ulcerative colitis. no dysplasia identified. Repeat overdue since 11/10/15. Declines colonoscopy, but is open to do Colo-guard    Labs 10/12/2023 Blood glucose 109, CHOL 313, Triglycerides 198, LDL 228, Otherwise.       Health Maintenance   Topic Date Due   COVID-19 Vaccine (1) Never done   Hepatitis C Screening  Never done   Pneumococcal Vaccine: 50+ Years (1 of 1 - PCV) Never done   Colonoscopy  11/10/2015   DTaP/Tdap/Td (3 - Td or Tdap) 07/09/2022   Zoster Vaccines- Shingrix (2 of 2) 09/22/2023   Influenza Vaccine  Never done   Medicare Annual Wellness (AWV)  10/10/2024   Meningococcal B Vaccine  Aged Out   Review of Systems  Constitutional:  Negative for fever and malaise/fatigue.  HENT:  Negative for congestion.   Eyes:  Negative for blurred vision.  Respiratory:  Negative for cough and shortness of breath.   Cardiovascular:  Negative for chest pain, palpitations and leg swelling.  Gastrointestinal:  Negative for vomiting.  Musculoskeletal:  Negative for back pain.  Skin:  Negative for rash.  Neurological:  Negative for loss of consciousness and headaches.   Objective:  Vitals: body mass index is 26.09 kg/m. Today's Vitals   10/12/24 0931  BP: 120/80  Pulse: 78  SpO2: 97%  Weight: 191 lb (86.6 kg)  Height: 5' 11.75 (1.822 m)  PainSc: 0-No pain   Physical Exam Vitals and nursing note reviewed. Exam conducted with a chaperone present.  Constitutional:      General: He is awake. He is not in acute distress.    Appearance: Normal appearance. He is not ill-appearing or toxic-appearing.  HENT:     Head: Normocephalic and atraumatic.     Right Ear: Tympanic membrane, ear canal and external ear normal.  Left Ear: Tympanic membrane, ear canal and external ear normal.     Mouth/Throat:     Pharynx: Oropharynx is clear.  Eyes:     Extraocular Movements: Extraocular movements intact.     Pupils: Pupils are equal, round, and reactive to light.  Neck:     Thyroid: No thyroid mass, thyromegaly or thyroid tenderness.     Vascular: No carotid bruit.  Cardiovascular:     Rate and Rhythm: Normal rate and regular rhythm. No extrasystoles are present.    Pulses:          Dorsalis pedis pulses are 2+ on the  right side and 2+ on the left side.       Posterior tibial pulses are 2+ on the right side and 2+ on the left side.     Heart sounds: Normal heart sounds. No murmur heard.    No friction rub. No gallop.  Pulmonary:     Effort: Pulmonary effort is normal.     Breath sounds: Normal breath sounds. No decreased breath sounds, wheezing, rhonchi or rales.  Chest:     Chest wall: No mass.  Abdominal:     Palpations: Abdomen is soft. There is no hepatomegaly, splenomegaly or mass.     Tenderness: There is no abdominal tenderness.     Hernia: No hernia is present.  Genitourinary:    Prostate: Normal. Not enlarged, not tender and no nodules present.     Rectum: Normal. Guaiac result negative.     Comments: Perianal Erythema  Musculoskeletal:     Cervical back: Normal range of motion.     Right lower leg: No edema.     Left lower leg: No edema.     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   Lipoma 1.5 inches on length on the left side of the back.   Feet:     Comments: Subungal Hematoma  Lymphadenopathy:     Cervical: No cervical adenopathy.     Upper Body:     Right upper body: No supraclavicular adenopathy.     Left upper body: No supraclavicular adenopathy.  Skin:    General: Skin is warm and dry.     Findings: Rash present.     Comments: Rash on both ankles.   Neurological:     General: No focal deficit present.     Mental Status: He is alert and oriented to person, place, and time. Mental status is at baseline.     Cranial Nerves: Cranial nerves 2-12 are intact.     Sensory: Sensation is intact.     Motor: Motor function is intact.     Coordination: Coordination is intact.     Gait: Gait is intact.     Deep Tendon Reflexes: Reflexes are normal and symmetric.  Psychiatric:        Attention and Perception: Attention normal.        Mood and Affect: Mood normal.        Speech: Speech normal.        Behavior: Behavior normal. Behavior is cooperative.        Thought  Content: Thought content normal.        Cognition and Memory: Cognition and memory normal.        Judgment: Judgment normal.     Current Outpatient Medications  Medication Instructions   Acetylcarnitine HCl (ACETYL L-CARNITINE PO) Take by mouth.   aspirin EC 81 mg, Daily   BERBERINE CHLORIDE PO Take by  mouth.   Cholecalciferol (VITAMIN D-3) 25 MCG (1000 UT) CAPS Take by mouth.   clotrimazole -betamethasone  (LOTRISONE ) cream 1 Application, Topical, Daily   Coenzyme Q10 (COQ10) 100 MG CAPS 1 capsule, 2 times daily   GLUCOSAMINE SULFATE PO Take by mouth.   Multiple Vitamin (MULTIVITAMIN ADULT PO) Take by mouth.   niacin (VITAMIN B3) 250 mg, Daily at bedtime   NON FORMULARY [place], Magnesium 325mg , Ashwagadha 25 mg   nystatin  (MYCOSTATIN /NYSTOP ) powder 1 Application, Topical, 3 times daily   Omega-3 Fatty Acids (FISH OIL) 1000 MG CAPS Take by mouth.   PANTETHINE ER PO Take by mouth.   TURMERIC PO Take by mouth.   vitamin C 1,000 mg, Daily   Past Medical History:  Diagnosis Date   Hair loss    Hemorrhoids    Hyperlipidemia    Ulcerative colitis    Ventral hernia    Medical/Surgical History Narrative:  Allergic/Intolerant to: Allergies[1]  Past Surgical History:  Procedure Laterality Date   APPENDECTOMY     index finger surgery     TONSILLECTOMY     WISDOM TOOTH EXTRACTION     Family History  Problem Relation Age of Onset   Heart disease Father    Colon cancer Neg Hx    Family History Narrative:  Father with history of thoracic aortic aneurysm and coronary artery disease status post CABG x4.  1 brother with history of smoking diagnosed with coronary artery disease.  1 sister in good health    Social history narrative:  He is married. 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 Does not smoke. Drinks beer. Has 3 children. Wife also retired from [employer].   Most Recent Health Risks Assessment:   Most Recent  Social Determinants of Health (Including Hx of Tobacco, Alcohol, and Drug Use) SDOH Screenings   Food Insecurity: No Food Insecurity (10/08/2024)  Housing: Low Risk (10/08/2024)  Transportation Needs: No Transportation Needs (10/08/2024)  Utilities: Not At Risk (10/11/2023)  Alcohol Screen: Low Risk (10/08/2024)  Depression (PHQ2-9): Low Risk (10/12/2024)  Financial Resource Strain: Low Risk (10/08/2024)  Physical Activity: Sufficiently Active (10/08/2024)  Social Connections: Unknown (10/08/2024)  Stress: No Stress Concern Present (10/08/2024)  Tobacco Use: Medium Risk (10/12/2024)  Health Literacy: Adequate Health Literacy (10/11/2023)   Social History[2] Most Recent Functional Status Assessment:  Most Recent Fall Risk Assessment:    10/12/2024    9:40 AM  Fall Risk   Falls in the past year? 0  Number falls in past yr: 0  Injury with Fall? 0  Follow up Education provided;Falls evaluation completed   Most Recent Anxiety/Depression Screenings:    10/12/2024    9:40 AM 10/11/2023   10:32 AM  PHQ 2/9 Scores  PHQ - 2 Score 0 0    Most Recent Cognitive Screening:    10/11/2023   10:30 AM  6CIT Screen  What Year? 0 points  What month? 0 points  What time? 0 points  Count back from 20 0 points  Months in reverse 0 points  Repeat phrase 0 points  Total Score 0 points   Most Recent Vision/Hearing Screenings:No results found. Results:  Studies Obtained And Personally Reviewed By Me:   Colonoscopy 07/19/06 with these findings: 1. colon, 20 cm, biopsies: fragments of colonic mucosa with superficial hemorrhages in lamina propria. no active mucosal inflammation, granulomas or dysplasia identified. 2. rectum/colon, 5 cm, biopsy: active chronic mucosal proctitis consistent with ulcerative colitis.  no dysplasia identified. Repeat overdue since 11/10/15. Declines colonoscopy, but is open to do Colo-guard   Labs:  CBC w/ Differential Lab Results  Component Value Date   WBC 4.3 10/11/2024   RBC 5.00  10/11/2024   HGB 15.9 10/11/2024   HCT 48.0 10/11/2024   PLT 283 10/11/2024   MCV 96.0 10/11/2024   MCH 31.8 10/11/2024   MCHC 33.1 10/11/2024   RDW 12.0 10/11/2024   MPV 9.8 10/11/2024   LYMPHSABS 1,370 10/10/2020   MONOABS 476 03/17/2017   BASOSABS 39 10/11/2024    Comprehensive Metabolic Panel Lab Results  Component Value Date   NA 138 10/11/2024   K 4.8 10/11/2024   CL 105 10/11/2024   CO2 26 10/11/2024   GLUCOSE 109 (H) 10/11/2024   BUN 18 10/11/2024   CREATININE 1.01 10/11/2024   CALCIUM 9.5 10/11/2024   PROT 6.5 10/11/2024   ALBUMIN 4.2 03/17/2017   AST 26 10/11/2024   ALT 27 10/11/2024   ALKPHOS 48 03/17/2017   BILITOT 1.0 10/11/2024   EGFR 80 10/11/2024   GFRNONAA 85 10/10/2020   Lipid Panel  Lab Results  Component Value Date   CHOL 313 (H) 10/11/2024   HDL 47 10/11/2024   LDLCALC 228 (H) 10/11/2024   TRIG 198 (H) 10/11/2024   A1c Lab Results  Component Value Date   HGBA1C 5.6 10/11/2024    TSH Lab Results  Component Value Date   TSH 2.072 06/17/2011   PSA 2.80 Results for orders placed or performed in visit on 10/12/24  POCT URINALYSIS DIP (CLINITEK)  Result Value Ref Range   Color, UA yellow yellow   Clarity, UA clear clear   Glucose, UA negative negative mg/dL   Bilirubin, UA negative negative   Ketones, POC UA negative negative mg/dL   Spec Grav, UA 8.989 8.989 - 1.025   Blood, UA negative negative   pH, UA 7.0 5.0 - 8.0   POC PROTEIN,UA negative negative, trace   Urobilinogen, UA 0.2 0.2 or 1.0 E.U./dL   Nitrite, UA Negative Negative   Leukocytes, UA Negative Negative   Assessment & Plan:   Orders Placed This Encounter  Procedures   Ambulatory referral to Orthopedic Surgery    Referral Priority:   Routine    Referral Type:   Surgical    Referral Reason:   Specialty Services Required    Referred to Provider:   Camella Fallow, MD    Requested Specialty:   Orthopedic Surgery    Number of Visits Requested:   1   Ambulatory  referral to General Surgery    Referral Priority:   Routine    Referral Type:   Surgical    Referral Reason:   Specialty Services Required    Referred to Provider:   Vernetta Berg, MD    Requested Specialty:   General Surgery    Number of Visits Requested:   1   POCT URINALYSIS DIP (CLINITEK)   Meds ordered this encounter  Medications   clotrimazole -betamethasone  (LOTRISONE ) cream    Sig: Apply 1 Application topically daily.    Dispense:  30 g    Refill:  1   nystatin  (MYCOSTATIN /NYSTOP ) powder    Sig: Apply 1 Application topically 3 (three) times daily.    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   Referred to orthopedic surgery.   He complained of itchiness and  dryness around his anus. Physical exam revealed Perianal Erythema.    Lotrisone  cream applied topically once daily prescribed  Nystatin  powder applied topically three times daily prescribed.    Lipoma of back: He has a lipoma on left side of his back about 1.5 inches in length. He is interested in getting it removed.    Referred to general surgery.   Ulcerative Colitis: diagnosed by Dr. Princella Nida in 2007. Began as rectal bleeding in August 2007. Initially was treated with Imuran and Asacol , but he has since stopped taking these medications.    Hyperlipidemia: - refuses to be on statin medication. In 2007 he had a NEGATIVE Cardiolite study. Had carotid Dopplers in 2016 showing a 1 - 39% bilateral ICA stenosis - has not wanted to have repeat study. 10/11/2024 Lipid panel CHOL 313, Triglycerides 198, LDL 228, Otherwise WNL.       Colonoscopy 07/19/06 with these findings: 1. colon, 20 cm, biopsies: fragments of colonic mucosa with superficial hemorrhages in lamina propria. no active mucosal inflammation, granulomas or dysplasia identified. 2. rectum/colon, 5 cm, biopsy: active chronic mucosal proctitis consistent with ulcerative colitis. no  dysplasia identified. Repeat overdue since 11/10/15. Declines colonoscopy, but is open to do Colo-guard      Annual Wellness Visit done today including the all of the following: Reviewed patient's Family Medical History Reviewed patient's SDOH and reviewed tobacco, alcohol, and drug use.  Reviewed and updated list of patient's medical providers Assessment of cognitive impairment was done Assessed patient's functional ability Established a written schedule for health screening services Health Risk Assessent Completed and Reviewed  Discussed health benefits of physical activity, and encouraged him to engage in regular exercise appropriate for his age and condition.    I,Makayla C Reid,acting as a scribe for Ronal JINNY Hailstone, MD.,have documented all relevant documentation on the behalf of Ronal JINNY Hailstone, MD,as directed by  Ronal JINNY Hailstone, MD while in the presence of Ronal JINNY Hailstone, MD.  I, Ronal JINNY Hailstone, MD, have reviewed all documentation for and agree with the above Annual Wellness Visit documentation.  Ronal JINNY Hailstone, MD Internal Medicine 10/12/2024     [1] No Known Allergies [2]  Social History Tobacco Use   Smoking status: Never   Smokeless tobacco: Former    Quit date: 09/03/2006  Substance Use Topics   Alcohol use: Yes    Alcohol/week: 1.0 standard drink of alcohol    Types: 1 Glasses of wine per week    Comment: 1 glass of wine daily    Drug use: No   "

## 2024-10-11 ENCOUNTER — Other Ambulatory Visit: Payer: Medicare Other

## 2024-10-11 DIAGNOSIS — Z0000 Encounter for general adult medical examination without abnormal findings: Secondary | ICD-10-CM

## 2024-10-11 DIAGNOSIS — Z131 Encounter for screening for diabetes mellitus: Secondary | ICD-10-CM

## 2024-10-11 DIAGNOSIS — E782 Mixed hyperlipidemia: Secondary | ICD-10-CM

## 2024-10-11 DIAGNOSIS — N401 Enlarged prostate with lower urinary tract symptoms: Secondary | ICD-10-CM

## 2024-10-11 DIAGNOSIS — R931 Abnormal findings on diagnostic imaging of heart and coronary circulation: Secondary | ICD-10-CM

## 2024-10-11 DIAGNOSIS — M19041 Primary osteoarthritis, right hand: Secondary | ICD-10-CM

## 2024-10-11 DIAGNOSIS — E781 Pure hyperglyceridemia: Secondary | ICD-10-CM

## 2024-10-12 ENCOUNTER — Encounter: Payer: Self-pay | Admitting: Internal Medicine

## 2024-10-12 ENCOUNTER — Ambulatory Visit: Payer: Medicare Other | Admitting: Internal Medicine

## 2024-10-12 VITALS — BP 120/80 | HR 78 | Ht 71.75 in | Wt 191.0 lb

## 2024-10-12 DIAGNOSIS — M24549 Contracture, unspecified hand: Secondary | ICD-10-CM | POA: Diagnosis not present

## 2024-10-12 DIAGNOSIS — Z0001 Encounter for general adult medical examination with abnormal findings: Secondary | ICD-10-CM | POA: Diagnosis not present

## 2024-10-12 DIAGNOSIS — D171 Benign lipomatous neoplasm of skin and subcutaneous tissue of trunk: Secondary | ICD-10-CM

## 2024-10-12 DIAGNOSIS — E785 Hyperlipidemia, unspecified: Secondary | ICD-10-CM | POA: Diagnosis not present

## 2024-10-12 DIAGNOSIS — Z8719 Personal history of other diseases of the digestive system: Secondary | ICD-10-CM

## 2024-10-12 DIAGNOSIS — K5190 Ulcerative colitis, unspecified, without complications: Secondary | ICD-10-CM

## 2024-10-12 DIAGNOSIS — Z0000 Encounter for general adult medical examination without abnormal findings: Secondary | ICD-10-CM

## 2024-10-12 DIAGNOSIS — E782 Mixed hyperlipidemia: Secondary | ICD-10-CM

## 2024-10-12 DIAGNOSIS — N401 Enlarged prostate with lower urinary tract symptoms: Secondary | ICD-10-CM

## 2024-10-12 DIAGNOSIS — R931 Abnormal findings on diagnostic imaging of heart and coronary circulation: Secondary | ICD-10-CM

## 2024-10-12 LAB — CBC WITH DIFFERENTIAL/PLATELET
Absolute Lymphocytes: 1273 {cells}/uL (ref 850–3900)
Absolute Monocytes: 383 {cells}/uL (ref 200–950)
Basophils Absolute: 39 {cells}/uL (ref 0–200)
Basophils Relative: 0.9 %
Eosinophils Absolute: 112 {cells}/uL (ref 15–500)
Eosinophils Relative: 2.6 %
HCT: 48 % (ref 39.4–51.1)
Hemoglobin: 15.9 g/dL (ref 13.2–17.1)
MCH: 31.8 pg (ref 27.0–33.0)
MCHC: 33.1 g/dL (ref 31.6–35.4)
MCV: 96 fL (ref 81.4–101.7)
MPV: 9.8 fL (ref 7.5–12.5)
Monocytes Relative: 8.9 %
Neutro Abs: 2494 {cells}/uL (ref 1500–7800)
Neutrophils Relative %: 58 %
Platelets: 283 Thousand/uL (ref 140–400)
RBC: 5 Million/uL (ref 4.20–5.80)
RDW: 12 % (ref 11.0–15.0)
Total Lymphocyte: 29.6 %
WBC: 4.3 Thousand/uL (ref 3.8–10.8)

## 2024-10-12 LAB — COMPREHENSIVE METABOLIC PANEL WITH GFR
AG Ratio: 2 (calc) (ref 1.0–2.5)
ALT: 27 U/L (ref 9–46)
AST: 26 U/L (ref 10–35)
Albumin: 4.3 g/dL (ref 3.6–5.1)
Alkaline phosphatase (APISO): 56 U/L (ref 35–144)
BUN: 18 mg/dL (ref 7–25)
CO2: 26 mmol/L (ref 20–32)
Calcium: 9.5 mg/dL (ref 8.6–10.3)
Chloride: 105 mmol/L (ref 98–110)
Creat: 1.01 mg/dL (ref 0.70–1.28)
Globulin: 2.2 g/dL (ref 1.9–3.7)
Glucose, Bld: 109 mg/dL — ABNORMAL HIGH (ref 65–99)
Potassium: 4.8 mmol/L (ref 3.5–5.3)
Sodium: 138 mmol/L (ref 135–146)
Total Bilirubin: 1 mg/dL (ref 0.2–1.2)
Total Protein: 6.5 g/dL (ref 6.1–8.1)
eGFR: 80 mL/min/1.73m2

## 2024-10-12 LAB — LIPID PANEL
Cholesterol: 313 mg/dL — ABNORMAL HIGH
HDL: 47 mg/dL
LDL Cholesterol (Calc): 228 mg/dL — ABNORMAL HIGH
Non-HDL Cholesterol (Calc): 266 mg/dL — ABNORMAL HIGH
Total CHOL/HDL Ratio: 6.7 (calc) — ABNORMAL HIGH
Triglycerides: 198 mg/dL — ABNORMAL HIGH

## 2024-10-12 LAB — POCT URINALYSIS DIP (CLINITEK)
Bilirubin, UA: NEGATIVE
Blood, UA: NEGATIVE
Glucose, UA: NEGATIVE mg/dL
Ketones, POC UA: NEGATIVE mg/dL
Leukocytes, UA: NEGATIVE
Nitrite, UA: NEGATIVE
POC PROTEIN,UA: NEGATIVE
Spec Grav, UA: 1.01
Urobilinogen, UA: 0.2 U/dL
pH, UA: 7

## 2024-10-12 LAB — HEMOGLOBIN A1C
Hgb A1c MFr Bld: 5.6 %
Mean Plasma Glucose: 114 mg/dL
eAG (mmol/L): 6.3 mmol/L

## 2024-10-12 LAB — PSA: PSA: 2.8 ng/mL

## 2024-10-12 MED ORDER — NYSTATIN 100000 UNIT/GM EX POWD: 1.0000 | Freq: Three times a day (TID) | CUTANEOUS | 0 refills | Status: AC

## 2024-10-12 MED ORDER — CLOTRIMAZOLE-BETAMETHASONE 1-0.05 % EX CREA: 1.0000 | TOPICAL_CREAM | Freq: Every day | CUTANEOUS | 1 refills | Status: AC

## 2024-10-14 ENCOUNTER — Encounter: Payer: Self-pay | Admitting: Internal Medicine

## 2024-10-14 NOTE — Patient Instructions (Addendum)
 Referral made to Dr. Camella at [facility] for contracture of fifth finger right hand. He wants lipoma of back removed. Referred to Dr. Georgette Poli.Continue to work on diet. Should consider colonoscopy with hx of Ulcerative colitis. At minimum should do Cologard. He will call us  if he wants to do Cologard and we can order it.

## 2024-10-23 ENCOUNTER — Other Ambulatory Visit: Payer: Self-pay | Admitting: Surgery

## 2024-11-01 ENCOUNTER — Other Ambulatory Visit: Payer: Self-pay

## 2024-11-01 ENCOUNTER — Encounter (HOSPITAL_BASED_OUTPATIENT_CLINIC_OR_DEPARTMENT_OTHER): Payer: Self-pay | Admitting: Surgery

## 2024-11-01 NOTE — Progress Notes (Signed)
 Gave patient CHG soap with instructions, patient verbalized understanding.      Enhanced Recovery after Surgery for Orthopedics Enhanced Recovery after Surgery is a protocol used to improve the stress on your body and your recovery after surgery.  Patient Instructions  The night before surgery:  No food after midnight. ONLY clear liquids after midnight  The day of surgery (if you do NOT have diabetes):  Drink ONE (1) Pre-Surgery Clear Ensure as directed.   This drink was given to you during your hospital  pre-op appointment visit. The pre-op nurse will instruct you on the time to drink the  Pre-Surgery Ensure depending on your surgery time. Finish the drink at the designated time by the pre-op nurse.  Nothing else to drink after completing the  Pre-Surgery Clear Ensure.

## 2024-11-04 NOTE — Anesthesia Preprocedure Evaluation (Signed)
"                                    Anesthesia Evaluation  Patient identified by MRN, date of birth, ID band Patient awake    Reviewed: Allergy & Precautions, NPO status , Patient's Chart, lab work & pertinent test results  History of Anesthesia Complications Negative for: history of anesthetic complications  Airway Mallampati: II  TM Distance: >3 FB Neck ROM: Full    Dental no notable dental hx.    Pulmonary neg pulmonary ROS   Pulmonary exam normal        Cardiovascular negative cardio ROS Normal cardiovascular exam     Neuro/Psych negative neurological ROS     GI/Hepatic Neg liver ROS,,,UC   Endo/Other  negative endocrine ROS    Renal/GU negative Renal ROS     Musculoskeletal negative musculoskeletal ROS (+)    Abdominal   Peds  Hematology negative hematology ROS (+)   Anesthesia Other Findings LEFT BACK MASS  Reproductive/Obstetrics                              Anesthesia Physical Anesthesia Plan  ASA: 2  Anesthesia Plan: MAC   Post-op Pain Management: Minimal or no pain anticipated   Induction:   PONV Risk Score and Plan: 1 and Treatment may vary due to age or medical condition, Propofol  infusion and Ondansetron  Airway Management Planned: Natural Airway and Simple Face Mask  Additional Equipment: None  Intra-op Plan:   Post-operative Plan:   Informed Consent: I have reviewed the patients History and Physical, chart, labs and discussed the procedure including the risks, benefits and alternatives for the proposed anesthesia with the patient or authorized representative who has indicated his/her understanding and acceptance.       Plan Discussed with: CRNA  Anesthesia Plan Comments:          Anesthesia Quick Evaluation  "

## 2024-11-04 NOTE — H&P (Signed)
 " REFERRING PHYSICIAN: Perri Ronal PARAS, MD PROVIDER: VICENTA DASIE POLI, MD MRN: [id number] DOB: 1955-12-10 DATE OF ENCOUNTER: 10/23/2024 Subjective   Chief Complaint: Left back mass  History of Present Illness: [name] is a 69 y.o. male who is seen today as an office consultation for evaluation of left back mass  This is a pleasant 69 year old gentleman referred for a mass on his back. He reports he has had it for several years but is now getting larger and causes significant discomfort when doing any physical activity including lifting and also when anything pushes against it as it is overlying a rib. He also has difficulty sleeping because of the mass. He has had no similar masses in the past and has no history of malignancy  Review of Systems: A complete review of systems was obtained from the patient. I have reviewed this information and discussed as appropriate with the patient. See HPI as well for other ROS.  ROS   Medical History: History reviewed. No pertinent past medical history.  There is no problem list on file for this patient.  History reviewed. No pertinent surgical history.   No Known Allergies  Current Outpatient Medications on File Prior to Visit  Medication Sig Dispense Refill  clotrimazole -betamethasone  (LOTRISONE ) 1-0.05 % cream Apply 1 Application topically  ascorbic acid, vitamin C, (VITAMIN C) 1000 MG tablet Take 1,000 mg by mouth once daily  aspirin 81 MG EC tablet Take 81 mg by mouth once daily  cholecalciferol (VITAMIN D3) 1000 unit capsule Take by mouth  coenzyme Q10-vitamin E 100-5 mg-unit Cap Take 1 capsule by mouth every 12 (twelve) hours   No current facility-administered medications on file prior to visit.   History reviewed. No pertinent family history.   Social History   Tobacco Use  Smoking Status Never  Smokeless Tobacco Never    Social History   Socioeconomic History  Marital status: Married  Tobacco Use  Smoking  status: Never  Smokeless tobacco: Never  Vaping Use  Vaping status: Unknown  Substance and Sexual Activity  Alcohol use: Never  Drug use: Never   Social Drivers of Corporate Investment Banker Strain: Low Risk (10/08/2024)  Received from [facility]  Overall Financial Resource Strain (CARDIA)  How hard is it for you to pay for the very basics like food, housing, medical care, and heating?: Not hard at all  Food Insecurity: No Food Insecurity (10/08/2024)  Received from [facility]  Hunger Vital Sign  Within the past 12 months, you worried that your food would run out before you got the money to buy more.: Never true  Within the past 12 months, the food you bought just didn't last and you didn't have money to get more.: Never true  Transportation Needs: No Transportation Needs (10/08/2024)  Received from [facility] - [facility]  In the past 12 months, has lack of transportation kept you from medical appointments or from getting medications?: No  In the past 12 months, has lack of transportation kept you from meetings, work, or from getting things needed for daily living?: No  Physical Activity: Sufficiently Active (10/08/2024)  Received from [facility]  Exercise Vital Sign  On average, how many days per week do you engage in moderate to strenuous exercise (like a brisk walk)?: 3 days  On average, how many minutes do you engage in exercise at this level?: 120 min  Stress: No Stress Concern Present (10/08/2024)  Received from [facility]  Occupational Health - Occupational Stress Questionnaire  Do you feel stress - tense, restless, nervous, or anxious, or unable to sleep at night because your mind is troubled all the time - these days?: Not at all  Social Connections: Unknown (10/08/2024)  Received from [facility]  Social Connection and Isolation Panel  How often do you get together with friends or relatives?: Twice a week  How often do you attend church or  religious services?: 1 to 4 times per year  Do you belong to any clubs or organizations such as church groups, unions, fraternal or athletic groups, or school groups?: Yes  How often do you attend meetings of the clubs or organizations you belong to?: Never  Housing Stability: Unknown (10/23/2024)  Housing Stability Vital Sign  Homeless in the Last Year: No   Objective:   Vitals:  10/23/24 1356  BP: (!) 166/100  Pulse: 80  Resp: 14  Temp: 36.7 C (98.1 F)  SpO2: 98%  Weight: 88.5 kg (195 lb)  Height: 182.9 cm (6')  PainSc: 2  PainLoc: Back   Body mass index is 26.45 kg/m.  Physical Exam   He appears well on exam  There is a 4 cm mass on his left upper back toward the axilla. It is very firm but is mobile. It is overlying a rib but does not appear attached to the rib. There are no skin changes and it is nonpulsatile  Labs, Imaging and Diagnostic Testing: I have reviewed his notes in the electronic medical records  Assessment and Plan:   Diagnoses and all orders for this visit:  Mass on back   This is a 4 cm mass on his left back approaching the flank. It is quite symptomatic and slowly getting larger. It is also quite firm on exam. I would recommend surgical examination to alleviate his symptoms as well as for complete histologic evaluation to rule out a desmoid tumor or some type of sarcoma or malignancy. It does feel quite hard to be a lipoma. We discussed the surgical procedure. We discussed the risks which includes but is not limited to bleeding, infection, the need for further surgery if malignancy is present, postoperative recovery, excetra. He agrees to proceed. Surgery will be scheduled  "

## 2024-11-05 ENCOUNTER — Encounter (HOSPITAL_BASED_OUTPATIENT_CLINIC_OR_DEPARTMENT_OTHER): Payer: Self-pay | Admitting: Anesthesiology

## 2024-11-05 ENCOUNTER — Ambulatory Visit (HOSPITAL_BASED_OUTPATIENT_CLINIC_OR_DEPARTMENT_OTHER)
Admission: RE | Admit: 2024-11-05 | Discharge: 2024-11-05 | Disposition: A | Discharge status: Home / Self Care | Source: Home / Self Care | Attending: Surgery | Admitting: Surgery

## 2024-11-05 ENCOUNTER — Encounter (HOSPITAL_BASED_OUTPATIENT_CLINIC_OR_DEPARTMENT_OTHER): Payer: Self-pay | Admitting: Surgery

## 2024-11-05 ENCOUNTER — Other Ambulatory Visit: Payer: Self-pay

## 2024-11-05 ENCOUNTER — Encounter (HOSPITAL_BASED_OUTPATIENT_CLINIC_OR_DEPARTMENT_OTHER)
Admission: RE | Disposition: A | Discharge status: Home / Self Care | Payer: Self-pay | Source: Home / Self Care | Attending: Surgery

## 2024-11-05 DIAGNOSIS — R222 Localized swelling, mass and lump, trunk: Secondary | ICD-10-CM | POA: Diagnosis not present

## 2024-11-05 HISTORY — DX: Basal cell carcinoma of skin of unspecified parts of face: C44.310

## 2024-11-05 MED ORDER — CHLORHEXIDINE GLUCONATE CLOTH 2 % EX PADS: 6.0000 | MEDICATED_PAD | Freq: Once | CUTANEOUS | Status: DC

## 2024-11-05 MED ORDER — TRAMADOL HCL 50 MG PO TABS: 50.0000 mg | ORAL_TABLET | Freq: Four times a day (QID) | ORAL | 0 refills | Status: AC | PRN

## 2024-11-05 MED ORDER — LACTATED RINGERS IV SOLN: INTRAVENOUS | Status: DC | PRN

## 2024-11-05 MED ORDER — CEFAZOLIN SODIUM-DEXTROSE 2-4 GM/100ML-% IV SOLN
INTRAVENOUS | Status: AC
  Filled 2024-11-05: qty 100

## 2024-11-05 MED ORDER — FENTANYL CITRATE (PF) 100 MCG/2ML IJ SOLN
INTRAMUSCULAR | Status: AC
  Filled 2024-11-05: qty 2

## 2024-11-05 MED ORDER — PROPOFOL 500 MG/50ML IV EMUL
INTRAVENOUS | Status: AC
  Filled 2024-11-05: qty 50

## 2024-11-05 MED ORDER — PROPOFOL 10 MG/ML IV BOLUS
INTRAVENOUS | Status: DC | PRN
  Administered 2024-11-05: 40 mg via INTRAVENOUS
  Administered 2024-11-05: 20 mg via INTRAVENOUS

## 2024-11-05 MED ORDER — OXYCODONE HCL 5 MG PO TABS: 5.0000 mg | ORAL_TABLET | Freq: Once | ORAL | Status: DC | PRN

## 2024-11-05 MED ORDER — FENTANYL CITRATE (PF) 250 MCG/5ML IJ SOLN
INTRAMUSCULAR | Status: DC | PRN
  Administered 2024-11-05: 50 ug via INTRAVENOUS

## 2024-11-05 MED ORDER — CEFAZOLIN SODIUM-DEXTROSE 2-3 GM-%(50ML) IV SOLR
INTRAVENOUS | Status: DC | PRN
  Administered 2024-11-05: 2 g via INTRAVENOUS

## 2024-11-05 MED ORDER — DROPERIDOL 2.5 MG/ML IJ SOLN: 0.6250 mg | Freq: Once | INTRAMUSCULAR | Status: DC | PRN

## 2024-11-05 MED ORDER — PROPOFOL 500 MG/50ML IV EMUL
INTRAVENOUS | Status: DC | PRN
  Administered 2024-11-05: 100 ug/kg/min via INTRAVENOUS

## 2024-11-05 MED ORDER — FENTANYL CITRATE (PF) 100 MCG/2ML IJ SOLN: 25.0000 ug | INTRAMUSCULAR | Status: DC | PRN

## 2024-11-05 MED ORDER — BUPIVACAINE-EPINEPHRINE 0.5% -1:200000 IJ SOLN
INTRAMUSCULAR | Status: DC | PRN
  Administered 2024-11-05: 20 mL

## 2024-11-05 MED ORDER — ACETAMINOPHEN 500 MG PO TABS: 1000.0000 mg | ORAL_TABLET | ORAL | Status: DC

## 2024-11-05 MED ORDER — ENSURE PRE-SURGERY PO LIQD: 296.0000 mL | Freq: Once | ORAL | Status: DC

## 2024-11-05 MED ORDER — ACETAMINOPHEN 500 MG PO TABS
ORAL_TABLET | ORAL | Status: AC
  Filled 2024-11-05: qty 2

## 2024-11-05 MED ORDER — LACTATED RINGERS IV SOLN: INTRAVENOUS | Status: DC

## 2024-11-05 MED ORDER — ACETAMINOPHEN 500 MG PO TABS
1000.0000 mg | ORAL_TABLET | Freq: Once | ORAL | Status: AC
  Administered 2024-11-05: 1000 mg via ORAL

## 2024-11-05 MED ORDER — OXYCODONE HCL 5 MG/5ML PO SOLN: 5.0000 mg | Freq: Once | ORAL | Status: DC | PRN

## 2024-11-05 MED ORDER — CEFAZOLIN SODIUM-DEXTROSE 2-4 GM/100ML-% IV SOLN: 2.0000 g | INTRAVENOUS | Status: DC

## 2024-11-05 NOTE — Discharge Instructions (Addendum)
 You may shower starting tomorrow  Hold of on cardio at the gym until Friday  No swimming for at least 10 days  Ice pack, tylenol , and ibuprofen also for pain  No Tylenol  until 1:00pm today, if needed.

## 2024-11-05 NOTE — Interval H&P Note (Signed)
 History and Physical Interval Note:no change in H and P  11/05/2024 7:05 AM  [name]  has presented today for surgery, with the diagnosis of LEFT BACK MASS.  The various methods of treatment have been discussed with the patient and family. After consideration of risks, benefits and other options for treatment, the patient has consented to  Procedures with comments: EXCISION MASS, BACK (Left) - EXCISION LEFT BACK MASS as a surgical intervention.  [name] history has been reviewed, patient examined, no change in status, stable for surgery.  I have reviewed the patient's chart and labs.  Questions were answered to the patient's satisfaction.     [name]

## 2024-11-05 NOTE — Op Note (Signed)
" ° °  [name] 11/05/2024   Pre-op Diagnosis: LEFT BACK MASS     Post-op Diagnosis: LEFT BACK MASS (3 CM)  Procedures: EXCISION 3 CM LEFT BACK MASS  Surgeon(s): Vernetta Berg, MD  Anesthesia: Monitor Anesthesia Care  Staff:  Circulator: Elaine Avelina PARAS, RN Scrub Person: Bridgett Waddell PARAS, RN  Estimated Blood Loss: Minimal               Specimens: sent to path  Indications: This is a 69 year old gentleman who presents with a slowly growing mass on his left mid to upper back laterally.  The decision was made to proceed with excision of the mass for histologic evaluation  Findings: The mass was approximately 3 cm in size and was firm.  It did not appear consistent with a lipoma  Procedure: The patient was brought to the operating identified the correct patient.  He was placed upon on the operating room table and then turned into the right lateral decubitus position.  His left back was then prepped and draped over the area of the palpable mass.  Anesthesia was induced.  I then anesthetized the area of the palpable mass with Marcaine  and then made a transverse incision with a scalpel.  I then dissected down to the deep subcutaneous tissue with electrocautery.  I identified a firm, hard mass in the deep subcutaneous tissue.  I dissected circumferentially around the mass with the electrocautery.  I then was able to grasp the mass an Allis clamp and elevated and then complete the resection with the electrocautery.  It did not appear to be invading into underlying muscle.  It measured approximately 3 cm in size.  The mass was then sent to pathology for evaluation.  I achieved hemostasis with the cautery.  I injected further Marcaine  into the incision.  I then closed the deep subcutaneous tissue with interrupted 2-0 Vicryl sutures.  I then closed the superficial subcutaneous tissue with interrupted 3-0 Vicryl sutures and closed the skin with a running 4-0 Monocryl.  Dermabond was then  applied.  The patient tolerated the procedure well.  All counts were correct at the end of the procedure.  The patient was then taken in a stable condition from the operating room to the recovery room.          Berg Vernetta   Date: 11/05/2024  Time: 8:52 AM    "

## 2024-11-06 ENCOUNTER — Encounter (HOSPITAL_BASED_OUTPATIENT_CLINIC_OR_DEPARTMENT_OTHER): Payer: Self-pay | Admitting: Surgery

## 2025-10-15 ENCOUNTER — Other Ambulatory Visit

## 2025-10-17 ENCOUNTER — Ambulatory Visit: Admitting: Internal Medicine
